# Patient Record
Sex: Male | Born: 1960
Health system: Southern US, Community
[De-identification: ages and names within clinical notes are randomized; demographics above are authoritative.]

## PROBLEM LIST (undated history)

## (undated) DIAGNOSIS — K851 Biliary acute pancreatitis without necrosis or infection: Secondary | ICD-10-CM

## (undated) DIAGNOSIS — F329 Major depressive disorder, single episode, unspecified: Secondary | ICD-10-CM

## (undated) DIAGNOSIS — R351 Nocturia: Secondary | ICD-10-CM

## (undated) DIAGNOSIS — F32A Depression, unspecified: Secondary | ICD-10-CM

## (undated) DIAGNOSIS — M1990 Unspecified osteoarthritis, unspecified site: Secondary | ICD-10-CM

## (undated) DIAGNOSIS — S42409A Unspecified fracture of lower end of unspecified humerus, initial encounter for closed fracture: Secondary | ICD-10-CM

## (undated) DIAGNOSIS — R600 Localized edema: Secondary | ICD-10-CM

## (undated) DIAGNOSIS — I10 Essential (primary) hypertension: Secondary | ICD-10-CM

## (undated) DIAGNOSIS — R030 Elevated blood-pressure reading, without diagnosis of hypertension: Secondary | ICD-10-CM

## (undated) DIAGNOSIS — G4733 Obstructive sleep apnea (adult) (pediatric): Secondary | ICD-10-CM

## (undated) DIAGNOSIS — K219 Gastro-esophageal reflux disease without esophagitis: Secondary | ICD-10-CM

## (undated) DIAGNOSIS — J302 Other seasonal allergic rhinitis: Secondary | ICD-10-CM

## (undated) HISTORY — DX: Unspecified fracture of lower end of unspecified humerus, initial encounter for closed fracture: S42.409A

## (undated) HISTORY — DX: Major depressive disorder, single episode, unspecified: F32.9

## (undated) HISTORY — DX: Depression, unspecified: F32.A

## (undated) HISTORY — DX: Gastro-esophageal reflux disease without esophagitis: K21.9

## (undated) HISTORY — DX: Elevated blood-pressure reading, without diagnosis of hypertension: R03.0

## (undated) HISTORY — DX: Biliary acute pancreatitis without necrosis or infection: K85.10

## (undated) HISTORY — DX: Obstructive sleep apnea (adult) (pediatric): G47.33

## (undated) HISTORY — PX: REFRACTIVE SURGERY: SHX103

## (undated) HISTORY — DX: Other seasonal allergic rhinitis: J30.2

---

## 2005-05-06 ENCOUNTER — Ambulatory Visit: Payer: Self-pay | Admitting: Family Medicine

## 2005-05-08 ENCOUNTER — Ambulatory Visit: Payer: Self-pay | Admitting: Family Medicine

## 2006-01-19 ENCOUNTER — Ambulatory Visit: Payer: Self-pay | Admitting: Family Medicine

## 2006-06-02 ENCOUNTER — Ambulatory Visit: Payer: Self-pay | Admitting: Family Medicine

## 2006-07-24 ENCOUNTER — Ambulatory Visit: Payer: Self-pay | Admitting: Family Medicine

## 2006-08-05 ENCOUNTER — Ambulatory Visit: Payer: Self-pay | Admitting: Family Medicine

## 2008-06-19 ENCOUNTER — Ambulatory Visit: Payer: Self-pay | Admitting: Family Medicine

## 2008-06-19 DIAGNOSIS — S61209A Unspecified open wound of unspecified finger without damage to nail, initial encounter: Secondary | ICD-10-CM | POA: Insufficient documentation

## 2008-06-19 DIAGNOSIS — M79609 Pain in unspecified limb: Secondary | ICD-10-CM | POA: Insufficient documentation

## 2008-07-04 ENCOUNTER — Ambulatory Visit: Payer: Self-pay | Admitting: Family Medicine

## 2011-01-23 ENCOUNTER — Emergency Department: Payer: Self-pay | Admitting: Emergency Medicine

## 2011-12-02 ENCOUNTER — Encounter: Payer: Self-pay | Admitting: Internal Medicine

## 2011-12-02 ENCOUNTER — Encounter: Payer: Self-pay | Admitting: Family Medicine

## 2011-12-02 ENCOUNTER — Ambulatory Visit (INDEPENDENT_AMBULATORY_CARE_PROVIDER_SITE_OTHER): Payer: 59 | Admitting: Family Medicine

## 2011-12-02 VITALS — BP 134/84 | HR 65 | Temp 98.3°F | Ht 75.0 in | Wt 282.0 lb

## 2011-12-02 DIAGNOSIS — K219 Gastro-esophageal reflux disease without esophagitis: Secondary | ICD-10-CM

## 2011-12-02 DIAGNOSIS — Z0000 Encounter for general adult medical examination without abnormal findings: Secondary | ICD-10-CM

## 2011-12-02 DIAGNOSIS — Z8042 Family history of malignant neoplasm of prostate: Secondary | ICD-10-CM

## 2011-12-02 DIAGNOSIS — Z8249 Family history of ischemic heart disease and other diseases of the circulatory system: Secondary | ICD-10-CM

## 2011-12-02 DIAGNOSIS — Z1211 Encounter for screening for malignant neoplasm of colon: Secondary | ICD-10-CM

## 2011-12-02 DIAGNOSIS — L299 Pruritus, unspecified: Secondary | ICD-10-CM

## 2011-12-02 LAB — LIPID PANEL
Cholesterol: 189 mg/dL (ref 0–200)
HDL: 30 mg/dL — ABNORMAL LOW (ref >39.00)
LDL Cholesterol: 140 mg/dL — ABNORMAL HIGH (ref 0–99)
Triglycerides: 94 mg/dL (ref 0.0–149.0)
VLDL: 18.8 mg/dL (ref 0.0–40.0)

## 2011-12-02 LAB — COMPREHENSIVE METABOLIC PANEL
AST: 20 U/L (ref 0–37)
Alkaline Phosphatase: 81 U/L (ref 39–117)
BUN: 18 mg/dL (ref 6–23)
Calcium: 8.7 mg/dL (ref 8.4–10.5)
Creatinine, Ser: 1.1 mg/dL (ref 0.4–1.5)
Total Bilirubin: 0.6 mg/dL (ref 0.3–1.2)

## 2011-12-02 MED ORDER — LORATADINE 10 MG PO TABS: 10.0000 mg | ORAL_TABLET | Freq: Every day | ORAL | Status: AC

## 2011-12-02 MED ORDER — TRIAMCINOLONE ACETONIDE 0.1 % EX CREA: TOPICAL_CREAM | Freq: Two times a day (BID) | CUTANEOUS | Status: AC | PRN

## 2011-12-02 NOTE — Patient Instructions (Signed)
Go to the lab on the way out.  See Shirlee Limerick about your referral before you leave today. I would get a flu shot each fall.   We'll contact you with your lab report. Try to exercise more.  Call back with concerns.  Use claritin/loratadine 10mg  at night as needed for itching. If not improved, use triamcinolone as needed.

## 2011-12-02 NOTE — Progress Notes (Signed)
CPE- See plan.  Routine anticipatory guidance given to patient.  See health maintenance. Td prev done Flu shot encouraged.   Exercise encouraged Labs pending.  See notes on resulted labs.  PSA pending, FH prostate cancer but no dysuria or symptoms.  D/w patient [id number] for colon cancer screening, including IFOB vs. colonoscopy.  Risks and benefits of both were discussed and patient voiced understanding.  Pt elects for: colonoscopy  Dry and itchy skin on legs B in the winter.  No help with OTC meds.  Happens every winter last few years.  Not in summer.   PMH and SH reviewed  Meds, vitals, and allergies reviewed.   ROS: See HPI.  Otherwise negative.    GEN: nad, alert and oriented HEENT: mucous membranes moist NECK: supple w/o LA CV: rrr. PULM: ctab, no inc wob ABD: soft, +bs, soft ventral wall hernia noted.  EXT: no edema SKIN: no acute rash but mild excoriation on lower legs B Prostate gland firm and smooth, no enlargement, nodularity, tenderness, mass, asymmetry or induration.

## 2011-12-03 ENCOUNTER — Encounter: Payer: Self-pay | Admitting: Family Medicine

## 2011-12-03 DIAGNOSIS — Z0000 Encounter for general adult medical examination without abnormal findings: Secondary | ICD-10-CM | POA: Insufficient documentation

## 2011-12-03 DIAGNOSIS — K219 Gastro-esophageal reflux disease without esophagitis: Secondary | ICD-10-CM | POA: Insufficient documentation

## 2011-12-03 DIAGNOSIS — L299 Pruritus, unspecified: Secondary | ICD-10-CM | POA: Insufficient documentation

## 2011-12-03 NOTE — Assessment & Plan Note (Signed)
Controlled with H2 blockade.  D/w pt about weight loss.

## 2011-12-03 NOTE — Assessment & Plan Note (Signed)
Routine anticipatory guidance given to patient.  See health maintenance. Td prev done Flu shot encouraged.   Exercise encouraged Labs pending.  See notes on resulted labs.  PSA pending, FH prostate cancer but no dysuria or symptoms.  D/w patient [id number] for colon cancer screening, including IFOB vs. colonoscopy.  Risks and benefits of both were discussed and patient voiced understanding.  Pt elects for: colonoscopy

## 2011-12-03 NOTE — Assessment & Plan Note (Signed)
See notes on PSA.  DRE wnl.

## 2011-12-03 NOTE — Assessment & Plan Note (Signed)
Use claritin and then TAC if not improved.  He agrees. Likely related to dry skin in winter.  No help with OTC topicals.

## 2012-01-07 HISTORY — PX: COLONOSCOPY: SHX174

## 2012-01-13 ENCOUNTER — Ambulatory Visit (AMBULATORY_SURGERY_CENTER): Payer: 59

## 2012-01-13 ENCOUNTER — Encounter: Payer: Self-pay | Admitting: Family Medicine

## 2012-01-13 ENCOUNTER — Ambulatory Visit (INDEPENDENT_AMBULATORY_CARE_PROVIDER_SITE_OTHER): Payer: 59 | Admitting: Family Medicine

## 2012-01-13 VITALS — BP 132/88 | HR 68 | Temp 97.0°F | Wt 279.5 lb

## 2012-01-13 VITALS — Ht 75.0 in | Wt 276.0 lb

## 2012-01-13 DIAGNOSIS — Z1211 Encounter for screening for malignant neoplasm of colon: Secondary | ICD-10-CM

## 2012-01-13 DIAGNOSIS — L299 Pruritus, unspecified: Secondary | ICD-10-CM

## 2012-01-13 MED ORDER — PEG-KCL-NACL-NASULF-NA ASC-C 100 G PO SOLR: 1.0000 | Freq: Once | ORAL | Status: AC

## 2012-01-13 MED ORDER — PERMETHRIN 5 % EX CREA: TOPICAL_CREAM | Freq: Once | CUTANEOUS | Status: AC

## 2012-01-13 NOTE — Progress Notes (Signed)
  Subjective:    [name], male    DOB: 1961/03/30, 51 y.o.   MRN: [id number]  HPI CC: itchy skin  Itching all over "like crazy", diffusely for last several days, wakes him up at night, last 2 nights especially bad.  Thinks may have been exposed to scabies.  [place] (so scaling on forearms) and bit by something in mid back between shoulder blades which was very tender and itchy, but that is resolving on its own.  Lotion, benadryl spray hasn't helped.  No skin rash.  Itching mainly at night time.  Feels skin crawling.  Denies fevers/chills, abd pain, nausea/vomiting, oral lesions, joint pains, myalgias.  No new medicines, foods, soaps, shampoos, lotions, detergents.  Prior eval: dry and itchy skin on legs B in the winter. No help with OTC meds. Happens every winter last few years. Not in summer.   Thought dry skin from winter, rec loratadine and TAC.  Legs better - thinks claritin working.  Has been taking claritin regularly until yesterday when ran out of med.  No family member with itching.  Wife sleeps in separate room 2/2 pt snoring.  Review of Systems Per HPI    Objective:   Physical Exam  Nursing note and vitals reviewed. Constitutional: He appears well-developed and well-nourished. No distress.  HENT:  Mouth/Throat: Oropharynx is clear and moist. No oropharyngeal exudate.  Cardiovascular: Normal rate, regular rhythm, normal heart sounds and intact distal pulses.   Pulmonary/Chest: Effort normal and breath sounds normal. No respiratory distress. He has no wheezes. He has no rales.  Abdominal: Soft.       No HSM  Musculoskeletal: He exhibits no edema.  Skin: Skin is warm and dry. No rash noted.       Midline upper back with scab, surrounding erythema, pruritic. No rash noted throughout.       Assessment & Plan:

## 2012-01-13 NOTE — Assessment & Plan Note (Addendum)
Diffuse. rec restart claritin. Treat for scabies given possible exposure with permethrin. If not better, update [place]. No systemic sxs today.

## 2012-01-13 NOTE — Patient Instructions (Addendum)
We will treat you as scabies although not typical rash.   Use permethrin cream.  Apply at night ,leave on until next morning and then wash off. Wash all bedding.

## 2012-01-14 ENCOUNTER — Encounter: Payer: Self-pay | Admitting: Internal Medicine

## 2012-01-27 ENCOUNTER — Encounter: Payer: Self-pay | Admitting: Internal Medicine

## 2012-01-27 ENCOUNTER — Ambulatory Visit (AMBULATORY_SURGERY_CENTER): Payer: 59 | Admitting: Internal Medicine

## 2012-01-27 VITALS — BP 137/92 | HR 70 | Temp 96.2°F | Resp 18 | Ht 75.0 in | Wt 276.0 lb

## 2012-01-27 DIAGNOSIS — D128 Benign neoplasm of rectum: Secondary | ICD-10-CM

## 2012-01-27 DIAGNOSIS — D129 Benign neoplasm of anus and anal canal: Secondary | ICD-10-CM

## 2012-01-27 DIAGNOSIS — Z1211 Encounter for screening for malignant neoplasm of colon: Secondary | ICD-10-CM

## 2012-01-27 MED ORDER — SODIUM CHLORIDE 0.9 % IV SOLN: 500.0000 mL | INTRAVENOUS | Status: DC

## 2012-01-27 NOTE — Op Note (Signed)
[facility] [address] [place], [place]  [address]  COLONOSCOPY PROCEDURE REPORT  PATIENT:  Phillip Miles, Phillip Miles  MR#:  [id number] BIRTHDATE:  December 22, 1960, 50 yrs. old  GENDER:  male ENDOSCOPIST:  Iva Boop, MD, [facility] REF. BY:  Crawford Givens, M.D. PROCEDURE DATE:  01/27/2012 PROCEDURE:  Colonoscopy with biopsy ASA CLASS:  Class II INDICATIONS:  Routine Risk Screening MEDICATIONS:   These medications were titrated to patient response per physician's verbal order, Fentanyl 50 mcg IV, Versed 7 mg IV  DESCRIPTION OF PROCEDURE:   After the risks benefits and alternatives of the procedure were thoroughly explained, informed consent was obtained.  Digital rectal exam was performed and revealed no abnormalities and normal prostate.   The LB CF-[id number] endoscope was introduced through the anus and advanced to the cecum, which was identified by both the appendix and ileocecal valve, without limitations.  The quality of the prep was excellent, using MoviPrep.  The instrument was then slowly withdrawn as the colon was fully examined. <<PROCEDUREIMAGES>>  FINDINGS:  A diminutive polyp was found in the rectum. It was 2-3 mm in size.  This was otherwise a normal examination of the colon. Includes right colon retroflexion.   Retroflexed views in the rectum revealed no abnormalities.    The time to cecum = 1:33 minutes. The scope was then withdrawn in 12:53 minutes from the cecum and the procedure completed. COMPLICATIONS:  None ENDOSCOPIC IMPRESSION: 1) 2-3 mm diminutive polyp in the rectum - removed 2) Otherwise normal examination - excellent prep  REPEAT EXAM:  In for Colonoscopy, pending biopsy results.  Iva Boop, MD, [facility]  CC:  Crawford Givens, MD and The Patient  n. eSIGNED:   Iva Boop at 01/27/2012 08:48 AM  Laurena Bering, Peyton Najjar, [id number]

## 2012-01-27 NOTE — Patient Instructions (Signed)
YOU HAD AN ENDOSCOPIC PROCEDURE TODAY AT THE [facility]: Refer to the procedure report that was given to you for any specific questions about what was found during the examination.  If the procedure report does not answer your questions, please call your gastroenterologist to clarify.  If you requested that your care partner not be given the details of your procedure findings, then the procedure report has been included in a sealed envelope for you to review at your convenience later.  YOU SHOULD EXPECT: Some feelings of bloating in the abdomen. Passage of more gas than usual.  Walking can help get rid of the air that was put into your GI tract during the procedure and reduce the bloating. If you had a lower endoscopy (such as a colonoscopy or flexible sigmoidoscopy) you may notice spotting of blood in your stool or on the toilet paper. If you underwent a bowel prep for your procedure, then you may not have a normal bowel movement for a few days.  DIET: Your first meal following the procedure should be a light meal and then it is ok to progress to your normal diet.  A half-sandwich or bowl of soup is an example of a good first meal.  Heavy or fried foods are harder to digest and may make you feel nauseous or bloated.  Likewise meals heavy in dairy and vegetables can cause extra gas to form and this can also increase the bloating.  Drink plenty of fluids but you should avoid alcoholic beverages for 24 hours.  ACTIVITY: Your care partner should take you home directly after the procedure.  You should plan to take it easy, moving slowly for the rest of the day.  You can resume normal activity the day after the procedure however you should NOT DRIVE or use heavy machinery for 24 hours (because of the sedation medicines used during the test).    SYMPTOMS TO REPORT IMMEDIATELY: A gastroenterologist can be reached at any hour.  During normal business hours, 8:30 AM to 5:00 PM Monday through Friday,  call [phone].  After hours and on weekends, please call the GI answering service at [phone] who will take a message and have the physician on call contact you.   Following lower endoscopy (colonoscopy or flexible sigmoidoscopy):  Excessive amounts of blood in the stool  Significant tenderness or worsening of abdominal pains  Swelling of the abdomen that is new, acute  Fever of 100F or higher  Following upper endoscopy (EGD)  Vomiting of blood or coffee ground material  New chest pain or pain under the shoulder blades  Painful or persistently difficult swallowing  New shortness of breath  Fever of 100F or higher  Black, tarry-looking stools  FOLLOW UP: If any biopsies were taken you will be contacted by phone or by letter within the next 1-3 weeks.  Call your gastroenterologist if you have not heard about the biopsies in 3 weeks.  Our staff will call the home number listed on your records the next business day following your procedure to check on you and address any questions or concerns that you may have at that time regarding the information given to you following your procedure. This is a courtesy call and so if there is no answer at the home number and we have not heard from you through the emergency physician on call, we will assume that you have returned to your regular daily activities without incident.  SIGNATURES/CONFIDENTIALITY: You and/or your care   partner have signed paperwork which will be entered into your electronic medical record.  These signatures attest to the fact that that the information above on your After Visit Summary has been reviewed and is understood.  Full responsibility of the confidentiality of this discharge information lies with you and/or your care-partner.  

## 2012-01-27 NOTE — Progress Notes (Signed)
Patient did not experience any of the following events: a burn prior to discharge; a fall within the facility; wrong site/side/patient/procedure/implant event; or a hospital transfer or hospital admission upon discharge from the facility. (G8907) Patient did not have preoperative order for IV antibiotic SSI prophylaxis. (G8918)  

## 2012-01-28 ENCOUNTER — Other Ambulatory Visit: Payer: Self-pay | Admitting: *Deleted

## 2012-01-28 NOTE — Telephone Encounter (Signed)
  Follow up Call-  Call back number 01/27/2012  Post procedure Call Back phone  # [phone]  Permission to leave phone message Yes     Patient questions:  Do you have a fever, pain , or abdominal swelling? no Pain Score  0 *  Have you tolerated food without any problems? yes  Have you been able to return to your normal activities? yes  Do you have any questions about your discharge instructions: Diet   no Medications  no Follow up visit  no  Do you have questions or concerns about your Care? no  Actions: * If pain score is 4 or above: No action needed, pain <4.   

## 2012-01-28 NOTE — Telephone Encounter (Signed)
  Follow up Call-  Call back number 01/27/2012  Post procedure Call Back phone  # [phone]  Permission to leave phone message Yes     Patient questions:  Do you have a fever, pain , or abdominal swelling? no Pain Score  0 *  Have you tolerated food without any problems? yes  Have you been able to return to your normal activities? yes  Do you have any questions about your discharge instructions: Diet   no Medications  no Follow up visit  no  Do you have questions or concerns about your Care? no  Actions: * If pain score is 4 or above: No action needed, pain <4.

## 2012-02-04 ENCOUNTER — Encounter: Payer: Self-pay | Admitting: Internal Medicine

## 2012-02-04 NOTE — Progress Notes (Signed)
Quick Note:  Hyperplastic polyp Repeat colonoscopy about 10 years 01/2022 ______

## 2012-09-08 DIAGNOSIS — K851 Biliary acute pancreatitis without necrosis or infection: Secondary | ICD-10-CM

## 2012-09-08 HISTORY — DX: Biliary acute pancreatitis without necrosis or infection: K85.10

## 2013-03-10 ENCOUNTER — Emergency Department: Payer: Self-pay | Admitting: Emergency Medicine

## 2013-03-10 LAB — COMPREHENSIVE METABOLIC PANEL
BUN: 15 mg/dL (ref 7–18)
Bilirubin,Total: 3.1 mg/dL — ABNORMAL HIGH (ref 0.2–1.0)
Calcium, Total: 8.7 mg/dL (ref 8.5–10.1)
Creatinine: 1.33 mg/dL — ABNORMAL HIGH (ref 0.60–1.30)
EGFR (African American): >60
EGFR (Non-African Amer.): >60
Glucose: 134 mg/dL — ABNORMAL HIGH (ref 65–99)
Osmolality: 284 (ref 275–301)
SGOT(AST): 216 U/L — ABNORMAL HIGH (ref 15–37)
SGPT (ALT): 358 U/L — ABNORMAL HIGH (ref 12–78)
Sodium: 141 mmol/L (ref 136–145)
Total Protein: 7.6 g/dL (ref 6.4–8.2)

## 2013-03-10 LAB — CBC
HCT: 43.9 % (ref 40.0–52.0)
HGB: 15 g/dL (ref 13.0–18.0)
MCH: 29.8 pg (ref 26.0–34.0)
MCHC: 34.3 g/dL (ref 32.0–36.0)
Platelet: 212 10*3/uL (ref 150–440)
RBC: 5.05 10*6/uL (ref 4.40–5.90)
RDW: 14.4 % (ref 11.5–14.5)
WBC: 9.7 10*3/uL (ref 3.8–10.6)

## 2013-03-10 LAB — TROPONIN I: Troponin-I: <0.02 ng/mL

## 2013-05-02 ENCOUNTER — Ambulatory Visit (INDEPENDENT_AMBULATORY_CARE_PROVIDER_SITE_OTHER): Payer: 59 | Admitting: Physician Assistant

## 2013-05-02 ENCOUNTER — Encounter: Payer: Self-pay | Admitting: Physician Assistant

## 2013-05-02 ENCOUNTER — Telehealth: Payer: Self-pay | Admitting: Internal Medicine

## 2013-05-02 ENCOUNTER — Other Ambulatory Visit (INDEPENDENT_AMBULATORY_CARE_PROVIDER_SITE_OTHER): Payer: 59

## 2013-05-02 VITALS — BP 112/90 | HR 76 | Temp 98.6°F | Ht 72.5 in | Wt 281.0 lb

## 2013-05-02 DIAGNOSIS — R1013 Epigastric pain: Secondary | ICD-10-CM

## 2013-05-02 DIAGNOSIS — R112 Nausea with vomiting, unspecified: Secondary | ICD-10-CM

## 2013-05-02 DIAGNOSIS — K859 Acute pancreatitis without necrosis or infection, unspecified: Secondary | ICD-10-CM

## 2013-05-02 DIAGNOSIS — Z860100 Personal history of colon polyps, unspecified: Secondary | ICD-10-CM

## 2013-05-02 LAB — CBC WITH DIFFERENTIAL/PLATELET
Basophils Absolute: 0 10*3/uL (ref 0.0–0.1)
HCT: 44.8 % (ref 39.0–52.0)
Lymphocytes Relative: 29 % (ref 12.0–46.0)
Lymphs Abs: 1.9 10*3/uL (ref 0.7–4.0)
Monocytes Relative: 11.7 % (ref 3.0–12.0)
Neutrophils Relative %: 55.3 % (ref 43.0–77.0)
Platelets: 171 10*3/uL (ref 150.0–400.0)
RDW: 13.6 % (ref 11.5–14.6)

## 2013-05-02 LAB — COMPREHENSIVE METABOLIC PANEL
ALT: 28 U/L (ref 0–53)
CO2: 29 mEq/L (ref 19–32)
GFR: 58.03 mL/min — ABNORMAL LOW (ref >60.00)
Potassium: 3.6 mEq/L (ref 3.5–5.1)
Sodium: 134 mEq/L — ABNORMAL LOW (ref 135–145)
Total Bilirubin: 0.8 mg/dL (ref 0.3–1.2)
Total Protein: 7.5 g/dL (ref 6.0–8.3)

## 2013-05-02 NOTE — Patient Instructions (Addendum)
Please go to the basement level to have your labs drawn.  We scheduled an Upper abdominal Ultrasound. Tues 05-03-2013. Arrive to [facility], 1st floor at 7:45 am.  Go first to patient registration,just inside front door ( by water fountain) .  Have nothing by mouth after midnight.   Try to stay on a low fat diet and full liquid diet until we get back to you with futher instructions.   Use the zofran for nausea and the Hydrocodone for pain.

## 2013-05-02 NOTE — Progress Notes (Signed)
Subjective:    [name], male    DOB: 06/17/1961, 52 y.o.   MRN: [id number]  HPI Phillip Miles is a pleasant generally healthy 52 year old white male known to Dr. Leone Payor from prior screening colonoscopy done in May of 2013. He was found to have 1 small hyperplastic polyp in the rectum and otherwise negative exam. He comes in today as an urgent work in with acute abdominal pain which started on Saturday night at about 1 AM 04/30/2013. He says pain was located in his upper abdomen with some radiation to his back. This was associated with multiple episodes of nausea and vomiting. He says he also had some diarrhea with ease. He says he would feel better for about an hour after he would vomit the pain would last an and then he would have another episode. He said he had a fever yesterday and chills at home and spent most of the day in bed. He says his temp highest was 102. He says he is not having severe pain, he is just uncomfortable. He has been on clear liquids and is tolerating clear liquids without difficulty, but feels weak.  Patient had had an ER visit with a similar episode of pain in early July of 2014. He was seen at the [facility] ER. We have obtained copies of those records and it appears that he did have an episode of pancreatitis. His total bilirubin at that time was 3.1 alkaline phosphatase 173 AST 216 and ALT of 358 WBC 9.7 hemoglobin 15 and lipase was greater than 10,000. He had CT scan of the abdomen and pelvis done with IV but no oral contrast  03/10/2013, and this was read as no acute abnormality He was not admitted, was discharged home with an antiemetic and pain medication and asked to followup with a  gastroenterologist. He says he felt better within the next 24 hours and gradually improved and did not seek medical care at that time. He states over the past several weeks he has had twinges of upper [place] pain but no severe episodes until this past weekend.  He does not drink  alcohol on a regular basis. He does have a mother and sister both of whom have had gallbladder problems.    Review of Systems  Constitutional: Positive for fever, chills and appetite change.  HENT: Negative.   Eyes: Negative.   Respiratory: Negative.   Gastrointestinal: Positive for nausea, vomiting, abdominal pain and diarrhea.  Endocrine: Negative.   Genitourinary: Negative.   Musculoskeletal: Negative.   Skin: Negative.   Allergic/Immunologic: Negative.   Neurological: Negative.   Hematological: Negative.   Psychiatric/Behavioral: Negative.    Outpatient Prescriptions Prior to Visit  Medication Sig Dispense Refill  . ranitidine (ZANTAC) 75 MG tablet Take 75 mg by mouth daily.      . permethrin (ELIMITE) 5 % cream        No facility-administered medications prior to visit.      No Known Allergies Patient Active Problem List   Diagnosis Date Noted  . Personal history of colonic polyps 05/02/2013  . GERD (gastroesophageal reflux disease) 12/03/2011  . Routine general medical examination at a health care facility 12/03/2011  . Itchy skin 12/03/2011  . FH: prostate cancer 12/03/2011   History  Substance Use Topics  . Smoking status: Former Smoker -- 2.00 packs/day for 23 years    Types: Cigarettes    Quit date: 09/08/1996  . Smokeless tobacco: Never Used  . Alcohol Use: Yes  Comment: rare   family history includes Alcohol abuse in his father; Diabetes in his mother; Heart disease in his father; Prostate cancer in his father.  Objective:   Physical Exam  well-developed white male in no acute distress, uncomfortable appearing blood pressure 112/90 pulse 76 temperature 98.6 height 6 foot weight 281. HEENT; nontraumatic normocephalic EOMI PERRLA sclera anicteric, Supple, Cardiovascular; regular rate and rhythm with S1-S2 no murmur or gallop, Pulmonary ;clear bilaterally, Abdomen; soft bowel sounds are present but hypoactive he is tender in the epigastrium there is no  guarding or rebound no palpable mass or hepatosplenomegaly, Rectal; exam not done, Extremities; no clubbing cyanosis or edema skin warm and dry, Psych; mood and affect normal and appropriate        Assessment & Plan:  #[age] year old male with an episode of acute pancreatitis 03/10/2013 associated with elevated LFTs and CT scan at that time read as negative. Episode suspicious for  Biliary pancreatitis. Patient now with recurrent similar episode onset approximately 48 hours ago. Suspect recurrent pancreatitis, rule out choledocholithiasis. Patient is stable and nontoxic appearing today, he did have a fever yesterday. I do not think he requires hospitalization at this time .  Plan; check CBC with differential, CMET and lipase today Schedule for upper abdominal ultrasound within the next 24 hours Low fat full liquid diet and push fluids Pt  has a prescription for Zofran and hydrocodone from his previous ER visit and will use these as needed for pain and nausea. He is instructed to seek ER care if he has worsening of abdominal pain temp of over 101 or recurrent intractable vomiting. We discussed possibility of need for procedures and other imaging depending on results of labs and ultrasound and he expressed understanding

## 2013-05-02 NOTE — Telephone Encounter (Signed)
Patient was seen last month at [facility] ER for pancreatitis.  Pain and fever returned this weekend.  He will come in and see Mike Gip PA today at 1:30

## 2013-05-03 ENCOUNTER — Telehealth: Payer: Self-pay | Admitting: *Deleted

## 2013-05-03 ENCOUNTER — Ambulatory Visit (HOSPITAL_COMMUNITY)
Admission: RE | Admit: 2013-05-03 | Discharge: 2013-05-03 | Disposition: A | Discharge status: Home / Self Care | Payer: 59 | Source: Ambulatory Visit | Attending: Physician Assistant | Admitting: Physician Assistant

## 2013-05-03 DIAGNOSIS — R112 Nausea with vomiting, unspecified: Secondary | ICD-10-CM

## 2013-05-03 DIAGNOSIS — K838 Other specified diseases of biliary tract: Secondary | ICD-10-CM | POA: Insufficient documentation

## 2013-05-03 DIAGNOSIS — K8020 Calculus of gallbladder without cholecystitis without obstruction: Secondary | ICD-10-CM

## 2013-05-03 DIAGNOSIS — K7689 Other specified diseases of liver: Secondary | ICD-10-CM | POA: Insufficient documentation

## 2013-05-03 DIAGNOSIS — R1013 Epigastric pain: Secondary | ICD-10-CM

## 2013-05-03 DIAGNOSIS — K859 Acute pancreatitis without necrosis or infection, unspecified: Secondary | ICD-10-CM

## 2013-05-03 NOTE — Progress Notes (Signed)
[place] has returned w/ cholelithiasis and borderline dilated CBD though it was not seen well. Lipase and LFT's normal. I recommended an MRCP next.  Iva Boop, MD, [facility]

## 2013-05-03 NOTE — Progress Notes (Signed)
Please call pt, see how he is feeling today - let him know his labs all look good- no pancreatitis this time, and liver testes are normal. The [place] does show gallstones and  I think he is having gallbladder attacks. Also duct not normal , but not well seen on the [place] . Would like him to get scheduled asap for MRCP to r/o CBD stone, and also get appt this week to see surgery. If he is feeling better he can advance his diet  To soft, low fat.  Thanks

## 2013-05-03 NOTE — Telephone Encounter (Signed)
Message copied by Daphine Deutscher on Tue May 03, 2013  1:51 PM ------      Message from: Sammuel Cooper      Created: Tue May 03, 2013  1:48 PM                   ----- Message -----         From: Iva Boop, MD         Sent: 05/03/2013  12:01 PM           To: Sammuel Cooper, PA-C                        ----- Message -----         From: Sammuel Cooper, PA-C         Sent: 05/02/2013   3:18 PM           To: Iva Boop, MD             ------

## 2013-05-03 NOTE — Telephone Encounter (Signed)
Sammuel Cooper, PA-C at 05/03/2013 1:48 PM   Status: Signed            Please call pt, see how he is feeling today - let him know his labs all look good- no pancreatitis this time, and liver testes are normal. The [place] does show gallstones and I think he is having gallbladder attacks. Also duct not normal , but not well seen on the [place] . Would like him to get scheduled asap for MRCP to r/o CBD stone, and also get appt this week to see surgery. If he is feeling better he can advance his diet To soft, low fat. Thanks  Patient given results and recommendations. Scheduled MRCP on 05/11/13 at 8:00 AM. NPO after midnight. Elease Hashimoto). Scheduled at [facility] on 05/04/13 at 10:00/10:30 AM with Dr. Andrey Campanile. Patient aware of appointments and instructions. He is feeling better and will advance diet to soft, low fat.

## 2013-05-04 ENCOUNTER — Encounter (INDEPENDENT_AMBULATORY_CARE_PROVIDER_SITE_OTHER): Payer: Self-pay | Admitting: General Surgery

## 2013-05-04 ENCOUNTER — Ambulatory Visit (INDEPENDENT_AMBULATORY_CARE_PROVIDER_SITE_OTHER): Payer: 59 | Admitting: General Surgery

## 2013-05-04 VITALS — BP 130/82 | HR 76 | Resp 14 | Ht 72.0 in | Wt 278.4 lb

## 2013-05-04 DIAGNOSIS — K8020 Calculus of gallbladder without cholecystitis without obstruction: Secondary | ICD-10-CM | POA: Insufficient documentation

## 2013-05-04 DIAGNOSIS — K859 Acute pancreatitis without necrosis or infection, unspecified: Secondary | ICD-10-CM

## 2013-05-04 DIAGNOSIS — K851 Biliary acute pancreatitis without necrosis or infection: Secondary | ICD-10-CM | POA: Insufficient documentation

## 2013-05-04 NOTE — Patient Instructions (Signed)

## 2013-05-05 ENCOUNTER — Encounter (HOSPITAL_COMMUNITY): Payer: Self-pay | Admitting: Pharmacy Technician

## 2013-05-05 NOTE — Progress Notes (Signed)
[name], male   DOB: 02/22/1961, 51 y.o.   MRN: [id number]  Chief Complaint  Patient presents with  . New Evaluation    eval GB    HPI Phillip Miles is a 51 y.o. male.   HPI 51 yo WM referred by Amy Esterwood, PA, and Dr Gessner for evaluation of abdominal pain and probable biliary pancreatitis. The patient states he's had several episodes of epigastric pain radiating to his back. He describes the pain as sharp. He states that this lasted hours at a time. He states that one episode happened after eating spaghetti. Most episodes are associated with nausea and vomiting. He had one episode that was quite severe for which prompted him to go to the emergency room where he was evaluated. He was diagnosed with pancreatitis. [facility] referring providers notes he had a bilirubin of 3.1, alkaline phosphatase 173, AST 216 an ALT of 358. CBC was normal. Lipase was greater than 10,000. A CT scan was within normal limits and he was sent home on medical treatment. He states he had another flare this past Sunday with multiple episodes of vomiting and diarrhea along with fever and chills. He states that he just felt crummy. He didn't have a lot of abdominal pain per se. He had an abdominal ultrasound which revealed gallstones. It is felt that his pancreatitis is due to cholelithiasis. He denies any alcohol. He denies any new medications. Past Medical History  Diagnosis Date  . Depression     h/o, job related, resolved as of 2013  . GERD (gastroesophageal reflux disease)   . Elbow fracture     left  . Colon polyps   . Pancreatitis     History reviewed. No pertinent past surgical history.  Family History  Problem Relation Age of Onset  . Diabetes Mother   . Alcohol abuse Father   . Prostate cancer Father   . Heart disease Father   . Cancer Father     prostate    Social History History  Substance Use Topics  . Smoking status: Former Smoker -- 2.00 packs/day for 23 years    Types:  Cigarettes    Quit date: 09/08/1996  . Smokeless tobacco: Never Used  . Alcohol Use: Yes     Comment: rare    No Known Allergies  Current Outpatient Prescriptions  Medication Sig Dispense Refill  . acetaminophen (TYLENOL) 325 MG tablet Take 650 mg by mouth every 6 (six) hours as needed for pain.       . ondansetron (ZOFRAN-ODT) 4 MG disintegrating tablet Take 4 mg by mouth 4 (four) times daily as needed for nausea.      . oxyCODONE (OXY IR/ROXICODONE) 5 MG immediate release tablet Take 10 mg by mouth every 4 (four) hours as needed for pain.      . ranitidine (ZANTAC) 150 MG tablet Take 150 mg by mouth daily.       No current facility-administered medications for this visit.    Review of Systems Review of Systems  Constitutional: Positive for fever. Negative for chills, appetite change and unexpected weight change.  HENT: Negative for congestion and trouble swallowing.   Eyes: Negative for visual disturbance.  Respiratory: Negative for chest tightness and shortness of breath.   Cardiovascular: Negative for chest pain and leg swelling.       No PND, no orthopnea, no DOE  Gastrointestinal: Positive for nausea, vomiting, abdominal pain and diarrhea.       See HPI    Genitourinary: Negative for dysuria and hematuria.  Musculoskeletal: Negative.   Skin: Negative for rash.  Neurological: Positive for headaches. Negative for seizures and speech difficulty.  Hematological: Does not bruise/bleed easily.  Psychiatric/Behavioral: Negative for behavioral problems and confusion.    Blood pressure 130/82, pulse 76, resp. rate 14, height 6' (1.829 m), weight 278 lb 6.4 oz (126.281 kg).  Physical Exam Physical Exam  Vitals reviewed. Constitutional: He is oriented to person, place, and time. He appears well-developed and well-nourished. No distress.  HENT:  Head: Normocephalic and atraumatic.  Right Ear: External ear normal.  Left Ear: External ear normal.  Eyes: Conjunctivae are normal.  No scleral icterus.  Neck: Normal range of motion. Neck supple. No tracheal deviation present. No thyromegaly present.  Cardiovascular: Normal rate, normal heart sounds and intact distal pulses.   Pulmonary/Chest: Effort normal and breath sounds normal. No respiratory distress. He has no wheezes.  Abdominal: Soft. He exhibits no distension. There is no tenderness. There is no rebound and no guarding.  Musculoskeletal: Normal range of motion. He exhibits no edema and no tenderness.  Lymphadenopathy:    He has no cervical adenopathy.  Neurological: He is alert and oriented to person, place, and time. He exhibits normal muscle tone.  Skin: Skin is warm and dry. No rash noted. He is not diaphoretic. No erythema. No pallor.  Psychiatric: He has a normal mood and affect. His behavior is normal. Judgment and thought content normal.    Data Reviewed Esterwood's note 8/25 Labs 8/25 - nml cbc, cmet, lipase COMPLETE ABDOMINAL ULTRASOUND 05/02/13 Comparison: None.  Findings:  Gallbladder: Cholelithiasis. Stones which measure up to 1.7 cm.  No wall thickening or pericholecystic fluid. Sonographic Murphy's  sign was not elicited.  Common bile duct: Poorly visualized due to overlying bowel gas.  Upper normal to minimally dilated, 8 mm. Upper normal in this age  group 6 - 7 mm.  Liver: Increased in echogenicity.  IVC: Poorly visualized due to overlying bowel gas.  Pancreas: Poorly visualized due to overlying bowel gas.  Spleen: Normal in size and echogenicity.  Right Kidney: 10.8 cm. No hydronephrosis.  Left Kidney: 11.5 cm. No hydronephrosis.  Abdominal aorta: Nonaneurysmal without ascites. Partially  obscured proximally.  IMPRESSION:  1. Cholelithiasis without acute cholecystitis.  2. Borderline to minimal dilatation of the common duct. This is  partially obscured. Choledocholithiasis or other cause of distal  obstruction cannot be excluded. Consider contrast enhanced CT or  MRCP.  3.  Portions of anatomy obscured by overlying bowel gas.  4. Hepatic steatosis.  Assessment    Biliary pancreatitis Symptomatic Cholelithiasis     Plan    I believe the patient's symptoms are consistent with gallbladder disease.  We discussed gallbladder disease. The patient was given educational material. We discussed non-operative and operative management. We discussed the signs & symptoms of acute cholecystitis  I discussed laparoscopic cholecystectomy with IOC in detail.  The patient was given educational material as well as diagrams detailing the procedure.  We discussed the risks and benefits of a laparoscopic cholecystectomy including, but not limited to bleeding, infection, injury to surrounding structures such as the intestine or liver, bile leak, retained gallstones, need to convert to an open procedure, prolonged diarrhea, blood clots such as  DVT, common bile duct injury, anesthesia risks, and possible need for additional procedures.  We discussed the typical post-operative recovery course. I explained that the likelihood of improvement of their symptoms is good.  The patient is currently scheduled for MRCP   on September 3. He is then scheduled for laparoscopic cholecystectomy for that Friday, September 5. The patient was told that if there were some abnormalities seen on his MRCP That it may change the sequence of events. My suspicion for persistent common bile duct stone is low since his LFTs have returned to normal. However I do think it's important to get the MRCP preoperatively. If there is a common bile duct stone still present he will need an ERCP prior to cholecystectomy and we may have to change our timing of his surgery  Errika Narvaiz M. Mechell Girgis, MD, FACS General, Bariatric, & Minimally Invasive Surgery [facility]          Tasheem Elms M 05/05/2013, 5:24 PM    

## 2013-05-11 ENCOUNTER — Telehealth (INDEPENDENT_AMBULATORY_CARE_PROVIDER_SITE_OTHER): Payer: Self-pay | Admitting: General Surgery

## 2013-05-11 ENCOUNTER — Ambulatory Visit (HOSPITAL_COMMUNITY)
Admission: RE | Admit: 2013-05-11 | Discharge: 2013-05-11 | Disposition: A | Discharge status: Home / Self Care | Payer: 59 | Source: Ambulatory Visit | Attending: Physician Assistant | Admitting: Physician Assistant

## 2013-05-11 ENCOUNTER — Other Ambulatory Visit: Payer: Self-pay | Admitting: Physician Assistant

## 2013-05-11 ENCOUNTER — Encounter (HOSPITAL_COMMUNITY): Payer: Self-pay

## 2013-05-11 ENCOUNTER — Encounter (HOSPITAL_COMMUNITY)
Admission: RE | Admit: 2013-05-11 | Discharge: 2013-05-11 | Disposition: A | Discharge status: Home / Self Care | Payer: 59 | Source: Ambulatory Visit | Attending: General Surgery | Admitting: General Surgery

## 2013-05-11 DIAGNOSIS — K8020 Calculus of gallbladder without cholecystitis without obstruction: Secondary | ICD-10-CM

## 2013-05-11 DIAGNOSIS — Z1389 Encounter for screening for other disorder: Secondary | ICD-10-CM | POA: Insufficient documentation

## 2013-05-11 HISTORY — DX: Unspecified osteoarthritis, unspecified site: M19.90

## 2013-05-11 HISTORY — DX: Nocturia: R35.1

## 2013-05-11 MED ORDER — GADOBENATE DIMEGLUMINE 529 MG/ML IV SOLN
20.0000 mL | Freq: Once | INTRAVENOUS | Status: AC | PRN
  Administered 2013-05-11: 20 mL via INTRAVENOUS

## 2013-05-11 NOTE — Progress Notes (Signed)
05/11/13 1104  OBSTRUCTIVE SLEEP APNEA  Have you ever been diagnosed with sleep apnea through a sleep study? No  Do you snore loudly (loud enough to be heard through closed doors)?  1  Do you often feel tired, fatigued, or sleepy during the daytime? 1  Has anyone observed you stop breathing during your sleep? 0  Do you have, or are you being treated for high blood pressure? 0  BMI more than 35 kg/m2? 1  Age over 52 years old? 1  Neck circumference greater than 40 cm/18 inches? 1  Gender: 1  Obstructive Sleep Apnea Score 6  Score 4 or greater  Results sent to PCP

## 2013-05-11 NOTE — Patient Instructions (Addendum)
Benedicto Capozzi [place]icek  05/11/2013                           YOUR PROCEDURE IS SCHEDULED ON:  05/13/13               PLEASE REPORT TO SHORT STAY CENTER AT : 10:45 am               CALL THIS NUMBER IF ANY PROBLEMS THE DAY OF SURGERY :               [phone]                      REMEMBER:   Do not eat food or drink liquids AFTER MIDNIGHT  May have clear liquids UNTIL 6 HOURS BEFORE SURGERY (7:00 am)  Clear liquids include soda, tea, black coffee, apple or grape juice, broth.  Take these medicines the morning of surgery with A SIP OF WATER:   may take oxycodone if needed   Do not wear jewelry, make-up   Do not wear lotions, powders, or perfumes.   Do not shave legs or underarms 12 hrs. before surgery (men may shave face)  Do not bring valuables to the hospital.  Contacts, dentures or bridgework may not be worn into surgery.  Leave suitcase in the car. After surgery it may be brought to your room.  For patients admitted to the hospital more than one night, checkout time is 11:00                          The day of discharge.   Patients discharged the day of surgery will not be allowed to drive home                             If going home same day of surgery, must have someone stay with you first                           24 hrs at home and arrange for some one to drive you home from hospital.    Special Instructions:   Please read over the following fact sheets that you were given:                        1. [place] PREPARING FOR SURGERY SHEET                                                X_____________________________________________________________________        Failure to follow these instructions may result in cancellation of your surgery

## 2013-05-11 NOTE — Telephone Encounter (Signed)
Call to discuss MRI-MRCP results. There is no evidence of choledocholithiasis. No evidence of pancreatitis. No evidence of lesions. Positive gallstones without any evidence of cholecystitis.  Told the patient we could proceed with surgery on Friday to remove his gallbladder. Patient had no questions

## 2013-05-13 ENCOUNTER — Encounter (HOSPITAL_COMMUNITY)
Admission: RE | Disposition: A | Discharge status: Home / Self Care | Payer: Self-pay | Source: Ambulatory Visit | Attending: General Surgery

## 2013-05-13 ENCOUNTER — Observation Stay (HOSPITAL_COMMUNITY)
Admission: RE | Admit: 2013-05-13 | Discharge: 2013-05-14 | Disposition: A | Discharge status: Home / Self Care | Payer: 59 | Source: Ambulatory Visit | Attending: General Surgery | Admitting: General Surgery

## 2013-05-13 ENCOUNTER — Ambulatory Visit (HOSPITAL_COMMUNITY): Payer: 59 | Admitting: Anesthesiology

## 2013-05-13 ENCOUNTER — Ambulatory Visit (HOSPITAL_COMMUNITY): Payer: 59

## 2013-05-13 ENCOUNTER — Encounter (HOSPITAL_COMMUNITY): Payer: Self-pay | Admitting: Anesthesiology

## 2013-05-13 ENCOUNTER — Encounter (HOSPITAL_COMMUNITY): Payer: Self-pay | Admitting: *Deleted

## 2013-05-13 DIAGNOSIS — K8010 Calculus of gallbladder with chronic cholecystitis without obstruction: Secondary | ICD-10-CM

## 2013-05-13 DIAGNOSIS — K859 Acute pancreatitis without necrosis or infection, unspecified: Secondary | ICD-10-CM | POA: Insufficient documentation

## 2013-05-13 DIAGNOSIS — Z01812 Encounter for preprocedural laboratory examination: Secondary | ICD-10-CM | POA: Insufficient documentation

## 2013-05-13 DIAGNOSIS — K829 Disease of gallbladder, unspecified: Secondary | ICD-10-CM

## 2013-05-13 DIAGNOSIS — Z01810 Encounter for preprocedural cardiovascular examination: Secondary | ICD-10-CM | POA: Insufficient documentation

## 2013-05-13 DIAGNOSIS — K219 Gastro-esophageal reflux disease without esophagitis: Secondary | ICD-10-CM | POA: Insufficient documentation

## 2013-05-13 DIAGNOSIS — K7689 Other specified diseases of liver: Secondary | ICD-10-CM | POA: Insufficient documentation

## 2013-05-13 HISTORY — PX: CHOLECYSTECTOMY: SHX55

## 2013-05-13 SURGERY — LAPAROSCOPIC CHOLECYSTECTOMY WITH INTRAOPERATIVE CHOLANGIOGRAM
Anesthesia: General | Site: Abdomen | Wound class: Contaminated

## 2013-05-13 MED ORDER — ACETAMINOPHEN 325 MG PO TABS: 650.0000 mg | ORAL_TABLET | ORAL | Status: DC | PRN

## 2013-05-13 MED ORDER — ENOXAPARIN SODIUM 40 MG/0.4ML ~~LOC~~ SOLN
40.0000 mg | SUBCUTANEOUS | Status: DC
  Administered 2013-05-14: 40 mg via SUBCUTANEOUS
  Filled 2013-05-13 (×2): qty 0.4

## 2013-05-13 MED ORDER — NEOSTIGMINE METHYLSULFATE 1 MG/ML IJ SOLN
INTRAMUSCULAR | Status: DC | PRN
  Administered 2013-05-13: 4 mg via INTRAVENOUS

## 2013-05-13 MED ORDER — OXYCODONE HCL 5 MG/5ML PO SOLN
5.0000 mg | Freq: Once | ORAL | Status: DC | PRN
  Filled 2013-05-13: qty 5

## 2013-05-13 MED ORDER — SUFENTANIL CITRATE 50 MCG/ML IV SOLN
INTRAVENOUS | Status: DC | PRN
  Administered 2013-05-13 (×4): 10 ug via INTRAVENOUS
  Administered 2013-05-13: 5 ug via INTRAVENOUS

## 2013-05-13 MED ORDER — ONDANSETRON HCL 4 MG/2ML IJ SOLN
INTRAMUSCULAR | Status: DC | PRN
  Administered 2013-05-13: 4 mg via INTRAVENOUS

## 2013-05-13 MED ORDER — SUCCINYLCHOLINE CHLORIDE 20 MG/ML IJ SOLN
INTRAMUSCULAR | Status: DC | PRN
  Administered 2013-05-13: 100 mg via INTRAVENOUS

## 2013-05-13 MED ORDER — MORPHINE SULFATE 2 MG/ML IJ SOLN
1.0000 mg | INTRAMUSCULAR | Status: DC | PRN
  Administered 2013-05-13: 2 mg via INTRAVENOUS
  Filled 2013-05-13: qty 1

## 2013-05-13 MED ORDER — GLYCOPYRROLATE 0.2 MG/ML IJ SOLN
INTRAMUSCULAR | Status: DC | PRN
  Administered 2013-05-13: 0.6 mg via INTRAVENOUS

## 2013-05-13 MED ORDER — LACTATED RINGERS IV SOLN
INTRAVENOUS | Status: DC
  Administered 2013-05-13: 14:00:00 via INTRAVENOUS
  Administered 2013-05-13: 1000 mL via INTRAVENOUS
  Administered 2013-05-13: 15:00:00 via INTRAVENOUS

## 2013-05-13 MED ORDER — 0.9 % SODIUM CHLORIDE (POUR BTL) OPTIME
TOPICAL | Status: DC | PRN
  Administered 2013-05-13: 1000 mL

## 2013-05-13 MED ORDER — KCL IN DEXTROSE-NACL 20-5-0.45 MEQ/L-%-% IV SOLN
INTRAVENOUS | Status: DC
  Administered 2013-05-13 – 2013-05-14 (×2): via INTRAVENOUS
  Filled 2013-05-13 (×3): qty 1000

## 2013-05-13 MED ORDER — BUPIVACAINE-EPINEPHRINE PF 0.25-1:200000 % IJ SOLN
INTRAMUSCULAR | Status: AC
  Filled 2013-05-13: qty 30

## 2013-05-13 MED ORDER — ONDANSETRON HCL 4 MG PO TABS: 4.0000 mg | ORAL_TABLET | Freq: Four times a day (QID) | ORAL | Status: DC | PRN

## 2013-05-13 MED ORDER — HYDROMORPHONE HCL PF 1 MG/ML IJ SOLN
INTRAMUSCULAR | Status: AC
  Filled 2013-05-13: qty 1

## 2013-05-13 MED ORDER — BUPIVACAINE-EPINEPHRINE 0.25% -1:200000 IJ SOLN
INTRAMUSCULAR | Status: DC | PRN
  Administered 2013-05-13: 50 mL

## 2013-05-13 MED ORDER — OXYCODONE HCL 5 MG PO TABS: 5.0000 mg | ORAL_TABLET | Freq: Once | ORAL | Status: DC | PRN

## 2013-05-13 MED ORDER — LABETALOL HCL 5 MG/ML IV SOLN
INTRAVENOUS | Status: DC | PRN
  Administered 2013-05-13: 5 mg via INTRAVENOUS

## 2013-05-13 MED ORDER — PROMETHAZINE HCL 25 MG/ML IJ SOLN: 6.2500 mg | INTRAMUSCULAR | Status: DC | PRN

## 2013-05-13 MED ORDER — PROPOFOL 10 MG/ML IV BOLUS
INTRAVENOUS | Status: DC | PRN
  Administered 2013-05-13: 50 mg via INTRAVENOUS
  Administered 2013-05-13: 250 mg via INTRAVENOUS
  Administered 2013-05-13: 50 mg via INTRAVENOUS
  Administered 2013-05-13: 100 mg via INTRAVENOUS

## 2013-05-13 MED ORDER — HYDROMORPHONE HCL PF 1 MG/ML IJ SOLN
0.2500 mg | INTRAMUSCULAR | Status: DC | PRN
  Administered 2013-05-13 (×3): 0.5 mg via INTRAVENOUS

## 2013-05-13 MED ORDER — ROCURONIUM BROMIDE 100 MG/10ML IV SOLN
INTRAVENOUS | Status: DC | PRN
  Administered 2013-05-13: 50 mg via INTRAVENOUS

## 2013-05-13 MED ORDER — DEXAMETHASONE SODIUM PHOSPHATE 10 MG/ML IJ SOLN
INTRAMUSCULAR | Status: DC | PRN
  Administered 2013-05-13: 10 mg via INTRAVENOUS

## 2013-05-13 MED ORDER — OXYCODONE-ACETAMINOPHEN 5-325 MG PO TABS
1.0000 | ORAL_TABLET | ORAL | Status: DC | PRN
  Administered 2013-05-14: 2 via ORAL
  Filled 2013-05-13: qty 2

## 2013-05-13 MED ORDER — MEPERIDINE HCL 50 MG/ML IJ SOLN: 6.2500 mg | INTRAMUSCULAR | Status: DC | PRN

## 2013-05-13 MED ORDER — PANTOPRAZOLE SODIUM 40 MG IV SOLR
40.0000 mg | INTRAVENOUS | Status: DC
  Administered 2013-05-13: 40 mg via INTRAVENOUS
  Filled 2013-05-13 (×2): qty 40

## 2013-05-13 MED ORDER — MIDAZOLAM HCL 5 MG/5ML IJ SOLN
INTRAMUSCULAR | Status: DC | PRN
  Administered 2013-05-13: 2 mg via INTRAVENOUS

## 2013-05-13 MED ORDER — LIDOCAINE HCL (CARDIAC) 20 MG/ML IV SOLN
INTRAVENOUS | Status: DC | PRN
  Administered 2013-05-13: 100 mg via INTRAVENOUS

## 2013-05-13 MED ORDER — DEXTROSE 5 % IV SOLN
2.0000 g | INTRAVENOUS | Status: AC
  Administered 2013-05-13: 2 g via INTRAVENOUS
  Filled 2013-05-13: qty 2

## 2013-05-13 MED ORDER — LACTATED RINGERS IV SOLN
INTRAVENOUS | Status: DC | PRN
  Administered 2013-05-13: 2000 mL via INTRAVENOUS

## 2013-05-13 MED ORDER — IOHEXOL 300 MG/ML  SOLN
INTRAMUSCULAR | Status: DC | PRN
  Administered 2013-05-13: 50 mL via INTRAVENOUS

## 2013-05-13 MED ORDER — ONDANSETRON HCL 4 MG/2ML IJ SOLN: 4.0000 mg | Freq: Four times a day (QID) | INTRAMUSCULAR | Status: DC | PRN

## 2013-05-13 SURGICAL SUPPLY — 52 items
ADH SKN CLS APL DERMABOND .7 (GAUZE/BANDAGES/DRESSINGS) ×1
APL SKNCLS STERI-STRIP NONHPOA (GAUZE/BANDAGES/DRESSINGS)
APPLIER CLIP 5 13 M/L LIGAMAX5 (MISCELLANEOUS)
APPLIER CLIP ROT 10 11.4 M/L (STAPLE)
APR CLP MED LRG 11.4X10 (STAPLE)
APR CLP MED LRG 5 ANG JAW (MISCELLANEOUS)
BAG SPEC RTRVL LRG 6X4 10 (ENDOMECHANICALS) ×1
BANDAGE ADHESIVE 1X3 (GAUZE/BANDAGES/DRESSINGS) ×3 IMPLANT
BENZOIN TINCTURE PRP APPL 2/3 (GAUZE/BANDAGES/DRESSINGS) ×1 IMPLANT
CANISTER SUCTION 2500CC (MISCELLANEOUS) ×2 IMPLANT
CATH ROBINSON RED A/P 14FR (CATHETERS) ×1 IMPLANT
CHLORAPREP W/TINT 26ML (MISCELLANEOUS) ×2 IMPLANT
CLIP APPLIE 5 13 M/L LIGAMAX5 (MISCELLANEOUS) IMPLANT
CLIP APPLIE ROT 10 11.4 M/L (STAPLE) IMPLANT
CLOTH BEACON ORANGE TIMEOUT ST (SAFETY) ×2 IMPLANT
COVER MAYO STAND STRL (DRAPES) ×1 IMPLANT
COVER SURGICAL LIGHT HANDLE (MISCELLANEOUS) ×1 IMPLANT
DECANTER SPIKE VIAL GLASS SM (MISCELLANEOUS) ×1 IMPLANT
DERMABOND ADVANCED (GAUZE/BANDAGES/DRESSINGS) ×1
DERMABOND ADVANCED .7 DNX12 (GAUZE/BANDAGES/DRESSINGS) IMPLANT
DRAPE C-ARM 42X120 X-RAY (DRAPES) IMPLANT
DRAPE LAPAROSCOPIC ABDOMINAL (DRAPES) ×2 IMPLANT
DRAPE UTILITY XL STRL (DRAPES) ×2 IMPLANT
DRSG TEGADERM 2-3/8X2-3/4 SM (GAUZE/BANDAGES/DRESSINGS) ×2 IMPLANT
ELECT REM PT RETURN 9FT ADLT (ELECTROSURGICAL) ×2
ELECTRODE REM PT RTRN 9FT ADLT (ELECTROSURGICAL) ×1 IMPLANT
GLOVE BIO SURGEON STRL SZ7.5 (GLOVE) ×2 IMPLANT
GLOVE BIOGEL M STRL SZ7.5 (GLOVE) ×1 IMPLANT
GLOVE BIOGEL PI IND STRL 7.0 (GLOVE) ×1 IMPLANT
GLOVE BIOGEL PI INDICATOR 7.0 (GLOVE) ×1
GLOVE INDICATOR 8.0 STRL GRN (GLOVE) ×2 IMPLANT
GOWN STRL NON-REIN LRG LVL3 (GOWN DISPOSABLE) ×1 IMPLANT
GOWN STRL REIN XL XLG (GOWN DISPOSABLE) ×6 IMPLANT
KIT BASIN OR (CUSTOM PROCEDURE TRAY) ×2 IMPLANT
NS IRRIG 1000ML POUR BTL (IV SOLUTION) ×2 IMPLANT
POUCH SPECIMEN RETRIEVAL 10MM (ENDOMECHANICALS) ×2 IMPLANT
SET CHOLANGIOGRAPH MIX (MISCELLANEOUS) ×1 IMPLANT
SET IRRIG TUBING LAPAROSCOPIC (IRRIGATION / IRRIGATOR) ×2 IMPLANT
SOLUTION ANTI FOG 6CC (MISCELLANEOUS) ×2 IMPLANT
STRIP CLOSURE SKIN 1/2X4 (GAUZE/BANDAGES/DRESSINGS) ×1 IMPLANT
SUT MNCRL AB 4-0 PS2 18 (SUTURE) ×2 IMPLANT
SUT VIC AB 2-0 SH 27 (SUTURE) ×2
SUT VIC AB 2-0 SH 27X BRD (SUTURE) IMPLANT
SUT VICRYL 0 UR6 27IN ABS (SUTURE) ×1 IMPLANT
SUT VICRYL 2 0 18  UND BR (SUTURE) ×1
SUT VICRYL 2 0 18 UND BR (SUTURE) IMPLANT
TOWEL OR 17X26 10 PK STRL BLUE (TOWEL DISPOSABLE) ×2 IMPLANT
TRAY LAP CHOLE (CUSTOM PROCEDURE TRAY) ×2 IMPLANT
TROCAR BLADELESS OPT 5 75 (ENDOMECHANICALS) ×6 IMPLANT
TROCAR XCEL BLUNT TIP 100MML (ENDOMECHANICALS) ×2 IMPLANT
TROCAR XCEL NON-BLD 11X100MML (ENDOMECHANICALS) ×1 IMPLANT
TUBING INSUFFLATION 10FT LAP (TUBING) ×2 IMPLANT

## 2013-05-13 NOTE — H&P (View-Only) (Signed)
[name], male   DOB: 1960/10/24, 52 y.o.   MRN: [id number]  Chief Complaint  Patient presents with  . New Evaluation    eval GB    HPI Phillip Miles is a 53 y.o. male.   HPI 52 yo WM referred by Mike Gip, PA, and Dr Leone Payor for evaluation of abdominal pain and probable biliary pancreatitis. The patient states he's had several episodes of epigastric pain radiating to his back. He describes the pain as sharp. He states that this lasted hours at a time. He states that one episode happened after eating spaghetti. Most episodes are associated with nausea and vomiting. He had one episode that was quite severe for which prompted him to go to the emergency room where he was evaluated. He was diagnosed with pancreatitis. [facility] referring providers notes he had a bilirubin of 3.1, alkaline phosphatase 173, AST 216 an ALT of 358. CBC was normal. Lipase was greater than 10,000. A CT scan was within normal limits and he was sent home on medical treatment. He states he had another flare this past Sunday with multiple episodes of vomiting and diarrhea along with fever and chills. He states that he just felt crummy. He didn't have a lot of abdominal pain per se. He had an abdominal ultrasound which revealed gallstones. It is felt that his pancreatitis is due to cholelithiasis. He denies any alcohol. He denies any new medications. Past Medical History  Diagnosis Date  . Depression     h/o, job related, resolved as of 2013  . GERD (gastroesophageal reflux disease)   . Elbow fracture     left  . Colon polyps   . Pancreatitis     History reviewed. No pertinent past surgical history.  Family History  Problem Relation Age of Onset  . Diabetes Mother   . Alcohol abuse Father   . Prostate cancer Father   . Heart disease Father   . Cancer Father     prostate    Social History History  Substance Use Topics  . Smoking status: Former Smoker -- 2.00 packs/day for 23 years    Types:  Cigarettes    Quit date: 09/08/1996  . Smokeless tobacco: Never Used  . Alcohol Use: Yes     Comment: rare    No Known Allergies  Current Outpatient Prescriptions  Medication Sig Dispense Refill  . acetaminophen (TYLENOL) 325 MG tablet Take 650 mg by mouth every 6 (six) hours as needed for pain.       Marland Kitchen ondansetron (ZOFRAN-ODT) 4 MG disintegrating tablet Take 4 mg by mouth 4 (four) times daily as needed for nausea.      Marland Kitchen oxyCODONE (OXY IR/ROXICODONE) 5 MG immediate release tablet Take 10 mg by mouth every 4 (four) hours as needed for pain.      . ranitidine (ZANTAC) 150 MG tablet Take 150 mg by mouth daily.       No current facility-administered medications for this visit.    Review of Systems Review of Systems  Constitutional: Positive for fever. Negative for chills, appetite change and unexpected weight change.  HENT: Negative for congestion and trouble swallowing.   Eyes: Negative for visual disturbance.  Respiratory: Negative for chest tightness and shortness of breath.   Cardiovascular: Negative for chest pain and leg swelling.       No PND, no orthopnea, no DOE  Gastrointestinal: Positive for nausea, vomiting, abdominal pain and diarrhea.       See HPI  Genitourinary: Negative for dysuria and hematuria.  Musculoskeletal: Negative.   Skin: Negative for rash.  Neurological: Positive for headaches. Negative for seizures and speech difficulty.  Hematological: Does not bruise/bleed easily.  Psychiatric/Behavioral: Negative for behavioral problems and confusion.    Blood pressure 130/82, pulse 76, resp. rate 14, height 6' (1.829 m), weight 278 lb 6.4 oz (126.281 kg).  Physical Exam Physical Exam  Vitals reviewed. Constitutional: He is oriented to person, place, and time. He appears well-developed and well-nourished. No distress.  HENT:  Head: Normocephalic and atraumatic.  Right Ear: External ear normal.  Left Ear: External ear normal.  Eyes: Conjunctivae are normal.  No scleral icterus.  Neck: Normal range of motion. Neck supple. No tracheal deviation present. No thyromegaly present.  Cardiovascular: Normal rate, normal heart sounds and intact distal pulses.   Pulmonary/Chest: Effort normal and breath sounds normal. No respiratory distress. He has no wheezes.  Abdominal: Soft. He exhibits no distension. There is no tenderness. There is no rebound and no guarding.  Musculoskeletal: Normal range of motion. He exhibits no edema and no tenderness.  Lymphadenopathy:    He has no cervical adenopathy.  Neurological: He is alert and oriented to person, place, and time. He exhibits normal muscle tone.  Skin: Skin is warm and dry. No rash noted. He is not diaphoretic. No erythema. No pallor.  Psychiatric: He has a normal mood and affect. His behavior is normal. Judgment and thought content normal.    Data Reviewed Esterwood's note 8/25 Labs 8/25 - nml cbc, cmet, lipase COMPLETE ABDOMINAL ULTRASOUND 05/02/13 Comparison: None.  Findings:  Gallbladder: Cholelithiasis. Stones which measure up to 1.7 cm.  No wall thickening or pericholecystic fluid. Sonographic Murphy's  sign was not elicited.  Common bile duct: Poorly visualized due to overlying bowel gas.  Upper normal to minimally dilated, 8 mm. Upper normal in this age  group 6 - 7 mm.  Liver: Increased in echogenicity.  IVC: Poorly visualized due to overlying bowel gas.  Pancreas: Poorly visualized due to overlying bowel gas.  Spleen: Normal in size and echogenicity.  Right Kidney: 10.8 cm. No hydronephrosis.  Left Kidney: 11.5 cm. No hydronephrosis.  Abdominal aorta: Nonaneurysmal without ascites. Partially  obscured proximally.  IMPRESSION:  1. Cholelithiasis without acute cholecystitis.  2. Borderline to minimal dilatation of the common duct. This is  partially obscured. Choledocholithiasis or other cause of distal  obstruction cannot be excluded. Consider contrast enhanced CT or  MRCP.  3.  Portions of anatomy obscured by overlying bowel gas.  4. Hepatic steatosis.  Assessment    Biliary pancreatitis Symptomatic Cholelithiasis     Plan    I believe the patient's symptoms are consistent with gallbladder disease.  We discussed gallbladder disease. The patient was given Agricultural engineer. We discussed non-operative and operative management. We discussed the signs & symptoms of acute cholecystitis  I discussed laparoscopic cholecystectomy with IOC in detail.  The patient was given educational material as well as diagrams detailing the procedure.  We discussed the risks and benefits of a laparoscopic cholecystectomy including, but not limited to bleeding, infection, injury to surrounding structures such as the intestine or liver, bile leak, retained gallstones, need to convert to an open procedure, prolonged diarrhea, blood clots such as  DVT, common bile duct injury, anesthesia risks, and possible need for additional procedures.  We discussed the typical post-operative recovery course. I explained that the likelihood of improvement of their symptoms is good.  The patient is currently scheduled for MRCP  on September 3. He is then scheduled for laparoscopic cholecystectomy for that Friday, September 5. The patient was told that if there were some abnormalities seen on his MRCP That it may change the sequence of events. My suspicion for persistent common bile duct stone is low since his LFTs have returned to normal. However I do think it's important to get the MRCP preoperatively. If there is a common bile duct stone still present he will need an ERCP prior to cholecystectomy and we may have to change our timing of his surgery  [name]. Andrey Campanile, MD, FACS General, Bariatric, & Minimally Invasive Surgery [facility] [place]          [facility] M 05/05/2013, 5:24 PM

## 2013-05-13 NOTE — Op Note (Signed)
Laparoscopic Cholecystectomy with IOC Procedure Note  Indications: This patient presents with symptomatic gallbladder disease and will undergo laparoscopic cholecystectomy. He had a normal preop MRCP which was done because of concerns of retained CBD stones.   52 yo WM referred by Mike Gip, PA, and Dr Leone Payor for evaluation of abdominal pain and probable biliary pancreatitis. The patient states he's had several episodes of epigastric pain radiating to his back. He describes the pain as sharp. He states that this lasted hours at a time. He states that one episode happened after eating spaghetti. Most episodes are associated with nausea and vomiting. He had one episode that was quite severe for which prompted him to go to the emergency room where he was evaluated. He was diagnosed with pancreatitis. [facility] referring providers notes he had a bilirubin of 3.1, alkaline phosphatase 173, AST 216 an ALT of 358. CBC was normal. Lipase was greater than 10,000. A CT scan was within normal limits and he was sent home on medical treatment. He states he had another flare this past Sunday with multiple episodes of vomiting and diarrhea along with fever and chills. He states that he just felt crummy. He didn't have a lot of abdominal pain per se. He had an abdominal ultrasound which revealed gallstones. It is felt that his pancreatitis is due to cholelithiasis. He denies any alcohol. He denies any new medications.   Pre-operative Diagnosis: chronic calculous cholecystitis, h/o biliary pancreatitis  Post-operative Diagnosis: Same  Surgeon: Atilano Ina   Assistants: none  Anesthesia: General endotracheal anesthesia  ASA Class: 2  Procedure Details  The patient was seen again in the Holding Room. The risks, benefits, complications, treatment options, and expected outcomes were discussed with the patient. The possibilities of reaction to medication, pulmonary aspiration, perforation of viscus, bleeding,  recurrent infection, finding a normal gallbladder, the need for additional procedures, failure to diagnose a condition, the possible need to convert to an open procedure, and creating a complication requiring transfusion or operation were discussed with the patient. The likelihood of improving the patient's symptoms with return to their baseline status is good.  The patient and/or family concurred with the proposed plan, giving informed consent. The site of surgery properly noted. The patient was taken to Operating Room, identified as TRAMPAS STETTNER and the procedure verified as Laparoscopic Cholecystectomy with Intraoperative Cholangiogram. A Time Out was held and the above information confirmed.  Prior to the induction of general anesthesia, antibiotic prophylaxis was administered. General endotracheal anesthesia was then administered and tolerated well. After the induction, the abdomen was prepped with Chloraprep and draped in the sterile fashion. The patient was positioned in the supine position.  Local anesthetic agent was injected into the skin near the umbilicus and an incision made just above the umbilicus. We dissected down to the abdominal fascia with blunt dissection.  The fascia was incised vertically and we entered the peritoneal cavity bluntly.  A pursestring suture of 0-Vicryl was placed around the fascial opening.  The Hasson cannula was inserted and secured with the stay suture.  Pneumoperitoneum was then created with CO2 and tolerated well without any adverse changes in the patient's vital signs. An 5-mm port was placed in the subxiphoid position.  Two 5-mm ports were placed in the right upper quadrant. All skin incisions were infiltrated with a local anesthetic agent before making the incision and placing the trocars.   We positioned the patient in reverse Trendelenburg, tilted slightly to the patient's left.  The gallbladder was  identified, the fundus grasped and retracted cephalad.  Adhesions were lysed bluntly and with the electrocautery where indicated, taking care not to injure any adjacent organs or viscus. The infundibulum was grasped and retracted laterally, exposing the peritoneum overlying the triangle of Calot. This was then divided and exposed in a blunt fashion. A critical view of the cystic duct and cystic artery was obtained.  The cystic duct was clearly identified and bluntly dissected circumferentially. The cystic duct was ligated with a clip distally.   An incision was made in the cystic duct and the [facility] cholangiogram catheter introduced. The catheter was secured using a clip. A cholangiogram was then obtained which showed good visualization of the distal and proximal biliary tree with no sign of filling defects or obstruction.  Contrast flowed easily into the duodenum. The catheter was then removed.   The cystic duct was then ligated with clips and divided. The cystic artery was identified, dissected free, ligated with clips and divided as well.   The gallbladder was dissected from the liver bed in retrograde fashion with the electrocautery. The gallbladder was entered because the posterior wall was adhered to the liver bed. There was some spillage of gallstones. These were quickly extracted after the 5 mm subxiphoid trocar was upsized to an 11 mm trocar. The gallbladder was removed and placed in an Endocatch sac.  There is a small pulsatile bleeder in the gallbladder bed. Hemostasis was achieved by placing a clip across it. The liver bed was irrigated and inspected. Hemostasis was achieved with the electrocautery. Copious irrigation was utilized and was repeatedly aspirated until clear. There were 2 additional gallstones that were extracted. The gallbladder and Endocatch sac were then removed through the umbilical port site.   The pursestring suture was used to close the umbilical fascia.    We again inspected the right upper quadrant for hemostasis.  The umbilical  closure was inspected and there was no air leak and nothing trapped within the closure. Pneumoperitoneum was released as we removed the trocars.  4-0 Monocryl was used to close the skin. The subxiphoid trocar site had some bleeding underneath the skin edges. There was a small arterial blood vessel. It was ligated and tied off. An additional 2-0 Vicryl suture was used for hemostasis in this area. 4 Monocryl was used to close the skin in a subcuticular fashion.  Dermabond was applied. The patient was then extubated and brought to the recovery room in stable condition. Instrument, sponge, and needle counts were correct at closure and at the conclusion of the case.   Findings: Chronic Cholecystitis with Cholelithiasis  Estimated Blood Loss: less than 50 mL         Drains: None         Specimens: Gallbladder           Complications: None; patient tolerated the procedure well.         Disposition: PACU - hemodynamically stable.         Condition: stable  [name]. Andrey Campanile, MD, FACS General, Bariatric, & Minimally Invasive Surgery [facility], [place]

## 2013-05-13 NOTE — Anesthesia Postprocedure Evaluation (Signed)
  Anesthesia Post-op Note  Patient: Phillip Miles  Procedure(s) Performed: Procedure(s) (LRB): LAPAROSCOPIC CHOLECYSTECTOMY WITH INTRAOPERATIVE CHOLANGIOGRAM (N/A)  Patient Location: PACU  Anesthesia Type: General  Level of Consciousness: awake and alert   Airway and Oxygen Therapy: Patient Spontanous Breathing  Post-op Pain: mild  Post-op Assessment: Post-op Vital signs reviewed, Patient's Cardiovascular Status Stable, Respiratory Function Stable, Patent Airway and No signs of Nausea or vomiting  Last Vitals:  Filed Vitals:   05/13/13 1609  BP: 158/84  Pulse: 59  Temp:   Resp: 14    Post-op Vital Signs: stable   Complications: No apparent anesthesia complications

## 2013-05-13 NOTE — Interval H&P Note (Signed)
History and Physical Interval Note:  05/13/2013 12:56 PM  Phillip Miles  has presented today for surgery, with the diagnosis of Symptomatic cholelithiasis , gallstones  The various methods of treatment have been discussed with the patient and family. After consideration of risks, benefits and other options for treatment, the patient has consented to  Procedure(s): LAPAROSCOPIC CHOLECYSTECTOMY WITH INTRAOPERATIVE CHOLANGIOGRAM (N/A) as a surgical intervention .  The patient's history has been reviewed, patient examined, no change in status, stable for surgery.  I have reviewed the patient's chart and labs.  Questions were answered to the patient's satisfaction.    [name]. Andrey Campanile, MD, FACS General, Bariatric, & Minimally Invasive Surgery [facility], [place]   [facility]

## 2013-05-13 NOTE — Anesthesia Preprocedure Evaluation (Addendum)
Anesthesia Evaluation  Patient identified by MRN, date of birth, ID band Patient awake    Reviewed: Allergy & Precautions, H&P , NPO status , Patient's Chart, lab work & pertinent test results  Airway Mallampati: II TM Distance: >3 FB Neck ROM: Full    Dental no notable dental hx. (+) Dental Advisory Given and Teeth Intact   Pulmonary former smoker,  breath sounds clear to auscultation  Pulmonary exam normal       Cardiovascular negative cardio ROS  Rhythm:Regular Rate:Normal     Neuro/Psych PSYCHIATRIC DISORDERS Depression negative neurological ROS     GI/Hepatic Neg liver ROS, GERD-  Medicated,  Endo/Other  negative endocrine ROS  Renal/GU negative Renal ROS     Musculoskeletal negative musculoskeletal ROS (+)   Abdominal (+) + obese,   Peds  Hematology negative hematology ROS (+)   Anesthesia Other Findings   Reproductive/Obstetrics                         Anesthesia Physical Anesthesia Plan  ASA: II  Anesthesia Plan: General   Post-op Pain Management:    Induction: Intravenous  Airway Management Planned: Oral ETT  Additional Equipment:   Intra-op Plan:   Post-operative Plan: Extubation in OR  Informed Consent: I have reviewed the patients History and Physical, chart, labs and discussed the procedure including the risks, benefits and alternatives for the proposed anesthesia with the patient or authorized representative who has indicated his/her understanding and acceptance.   Dental advisory given  Plan Discussed with: CRNA and Surgeon  Anesthesia Plan Comments:      Anesthesia Quick Evaluation

## 2013-05-13 NOTE — Preoperative (Signed)
Beta Blockers   Reason not to administer Beta Blockers:Not Applicable 

## 2013-05-13 NOTE — Transfer of Care (Signed)
Immediate Anesthesia Transfer of Care Note  Patient: Phillip Miles  Procedure(s) Performed: Procedure(s): LAPAROSCOPIC CHOLECYSTECTOMY WITH INTRAOPERATIVE CHOLANGIOGRAM (N/A)  Patient Location: PACU  Anesthesia Type:General  Level of Consciousness: awake, alert , oriented and patient cooperative  Airway & Oxygen Therapy: Patient Spontanous Breathing and Patient connected to face mask oxygen  Post-op Assessment: Report given to PACU RN, Post -op Vital signs reviewed and stable and Patient moving all extremities  Post vital signs: Reviewed and stable  Complications: No apparent anesthesia complications

## 2013-05-14 LAB — CBC
MCH: 29.3 pg (ref 26.0–34.0)
Platelets: 225 10*3/uL (ref 150–400)
RBC: 4.78 MIL/uL (ref 4.22–5.81)
WBC: 13.9 10*3/uL — ABNORMAL HIGH (ref 4.0–10.5)

## 2013-05-14 MED ORDER — OXYCODONE-ACETAMINOPHEN 5-325 MG PO TABS: 1.0000 | ORAL_TABLET | ORAL | Status: DC | PRN

## 2013-05-16 ENCOUNTER — Encounter (HOSPITAL_COMMUNITY): Payer: Self-pay | Admitting: General Surgery

## 2013-05-18 NOTE — Discharge Summary (Signed)
Physician Discharge Summary  Phillip Miles ZOX:[id number] DOB: 01-28-1961 DOA: 05/13/2013  PCP: Crawford Givens, MD  Admit date: 05/13/2013 Discharge date: 05/14/13  Recommendations for Outpatient Follow-up:    Follow-up Information   Follow up with Atilano Ina, MD On 06/08/2013. (8:45 AM)    Specialty:  General Surgery   Contact information:   [address] [place] [address] [phone]      Discharge Diagnoses:  Symptomatic cholelithiasis H/o biliary pancreatitis  Surgical Procedure: LAPAROSCOPIC CHOLECYSTECTOMY WITH IOC 05/13/13  Discharge Condition: good Disposition: home   Diet recommendation: low fat  Filed Weights   05/13/13 1700  Weight: 277 lb (125.646 kg)    Hospital Course:  The patient was brought to the hospital for a planned laparoscopic cholecystectomy with cholangiogram. He underwent the procedure without complication. He was kept overnight for observation. On postoperative day 1 he was deemed stable for discharge. His pain was controlled. His vital signs are stable. He was tolerating a diet. He was ambulating without difficulty. Discharge instructions have been discussed with the patient.   Discharge Instructions  Discharge Orders   Future Appointments Provider Department Dept Phone   06/08/2013 8:45 AM Atilano Ina, MD [facility], [place] [phone]   Future Orders Complete By Expires   Diet - low sodium heart healthy  As directed    Increase activity slowly  As directed        Medication List         acetaminophen 325 MG tablet  Commonly known as:  TYLENOL  Take 650 mg by mouth every 6 (six) hours as needed for pain.     ondansetron 4 MG disintegrating tablet  Commonly known as:  ZOFRAN-ODT  Take 4 mg by mouth 4 (four) times daily as needed for nausea.     oxyCODONE 5 MG immediate release tablet  Commonly known as:  Oxy IR/ROXICODONE  Take 10 mg by mouth every 4 (four) hours as needed for pain.     oxyCODONE-acetaminophen 5-325 MG per tablet  Commonly known as:  PERCOCET/ROXICET  Take 1-2 tablets by mouth every 4 (four) hours as needed.     ranitidine 150 MG tablet  Commonly known as:  ZANTAC  Take 150 mg by mouth daily.           Follow-up Information   Follow up with Atilano Ina, MD On 06/08/2013. (8:45 AM)    Specialty:  General Surgery   Contact information:   [address] [place] [address] [phone]        The results of significant diagnostics from this hospitalization (including imaging, microbiology, ancillary and laboratory) are listed below for reference.    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  Findings:  Water's views with eyes deviated toward the left and toward the right were obtained.  There is no intraorbital radiopaque foreign body.  No fracture or dislocation.  Paranasal sinuses are clear.  There is a bony spur along the left mid nasal septum.  IMPRESSION: No demonstrable intraorbital radiopaque foreign body.   Original Report Authenticated By: Bretta Bang, M.D.   Dg Cholangiogram Operative  05/13/2013   *RADIOLOGY REPORT*  Clinical Data: Cholelithiasis  INTRAOPERATIVE CHOLANGIOGRAM  Technique:  Multiple fluoroscopic spot radiographs were obtained during intraoperative cholangiogram and are submitted for interpretation post-operatively.  Comparison: MRCP 05/11/2013  Findings: No persistent filling defects in the common duct. Intrahepatic ducts are incompletely visualized, appearing decompressed centrally. Contrast passes into the duodenum.  IMPRESSION  Negative for retained common duct stone.   Original Report Authenticated By: D. Andria Rhein, MD   US Abdomen Complete  05/03/2013   *RADIOLOGY REPORT*  Clinical Data:  Pancreatitis.  Rule out common duct stone.  COMPLETE ABDOMINAL  ULTRASOUND  Comparison:  None.  Findings:  Gallbladder:  Cholelithiasis.  Stones which measure up to 1.7 cm. No wall thickening or pericholecystic fluid. Sonographic Murphy's sign was not elicited.  Common bile duct: Poorly visualized due to overlying bowel gas. Upper normal to minimally dilated, 8 mm.  Upper normal in this age group 6 - 7 mm.  Liver: Increased in echogenicity.  IVC: Poorly visualized due to overlying bowel gas.  Pancreas:  Poorly visualized due to overlying bowel gas.  Spleen:  Normal in size and echogenicity.  Right Kidney:  10.8 cm. No hydronephrosis.  Left Kidney:  11.5 cm. No hydronephrosis.  Abdominal aorta:  Nonaneurysmal without ascites.  Partially obscured proximally.  IMPRESSION:  1.  Cholelithiasis without acute cholecystitis. 2.  Borderline to minimal dilatation of the common duct.  This is partially obscured.  Choledocholithiasis or other cause of distal obstruction cannot be excluded.  Consider contrast enhanced CT or MRCP. 3. Portions of anatomy obscured by overlying bowel gas. 4.  Hepatic steatosis.   Original Report Authenticated By: Jeronimo Greaves, M.D.   Mr 3d Recon At Scanner  05/11/2013   *RADIOLOGY REPORT*  Clinical Data:  Cholelithiasis with recent pancreatitis.  Evaluate for choledocholithiasis.  MRI ABDOMEN WITHOUT AND WITH CONTRAST (INCLUDING MRCP)  Technique:  Multiplanar multisequence MR imaging of the abdomen was performed both before and after the administration of intravenous contrast. Heavily T2-weighted images of the biliary and pancreatic ducts were obtained, and three-dimensional MRCP images were rendered by post processing.  Contrast: 20mL MULTIHANCE GADOBENATE DIMEGLUMINE 529 MG/ML IV SOLN  Comparison:  Ultrasound abdomen dated 05/03/2013  Findings:  Liver is within normal limits.  No suspicious/enhancing hepatic lesions.  No hepatic steatosis.  Spleen and adrenal glands are unremarkable.  Pancreas is within normal limits.  No peripancreatic inflammatory changes.   No evidence of pancreatic divisum.  Multiple layering gallstones, without associate inflammatory changes.  No intrahepatic or extrahepatic ductal dilatation. Common duct measures 4 mm.  No choledocholithiasis is seen.  Kidneys are within normal limits.  No hydronephrosis.  No abdominal ascites.  No suspicious abdominal lymphadenopathy.  No focal osseous lesions.  IMPRESSION: Cholelithiasis without evidence of acute cholecystitis.  No choledocholithiasis is seen.  No peripancreatic inflammatory changes.  No evidence of pancreatic divisum.   Original Report Authenticated By: Charline Bills, M.D.   Mr Abd W/wo Cm/mrcp  05/11/2013   *RADIOLOGY REPORT*  Clinical Data:  Cholelithiasis with recent pancreatitis.  Evaluate for choledocholithiasis.  MRI ABDOMEN WITHOUT AND WITH CONTRAST (INCLUDING MRCP)  Technique:  Multiplanar multisequence MR imaging of the abdomen was performed both before and after the administration of intravenous contrast. Heavily T2-weighted images of the biliary and pancreatic ducts were obtained, and three-dimensional MRCP images were rendered by post processing.  Contrast: 20mL MULTIHANCE GADOBENATE DIMEGLUMINE 529 MG/ML IV SOLN  Comparison:  Ultrasound abdomen dated 05/03/2013  Findings:  Liver is within normal limits.  No suspicious/enhancing hepatic lesions.  No hepatic steatosis.  Spleen and adrenal glands are unremarkable.  Pancreas is within normal limits.  No peripancreatic inflammatory changes.  No evidence of pancreatic  divisum.  Multiple layering gallstones, without associate inflammatory changes.  No intrahepatic or extrahepatic ductal dilatation. Common duct measures 4 mm.  No choledocholithiasis is seen.  Kidneys are within normal limits.  No hydronephrosis.  No abdominal ascites.  No suspicious abdominal lymphadenopathy.  No focal osseous lesions.  IMPRESSION: Cholelithiasis without evidence of acute cholecystitis.  No choledocholithiasis is seen.  No peripancreatic inflammatory  changes.  No evidence of pancreatic divisum.   Original Report Authenticated By: Charline Bills, M.D.     Labs:   Recent Labs Lab 05/14/13 0455  WBC 13.9*  HGB 14.0  HCT 41.5  MCV 86.8  PLT 225    Time coordinating discharge: 10 minutes  Signed:  Atilano Ina, MD [facility], [place] [phone] 05/18/2013, 11:32 AM

## 2013-06-08 ENCOUNTER — Encounter (INDEPENDENT_AMBULATORY_CARE_PROVIDER_SITE_OTHER): Payer: Self-pay | Admitting: General Surgery

## 2013-06-08 ENCOUNTER — Ambulatory Visit (INDEPENDENT_AMBULATORY_CARE_PROVIDER_SITE_OTHER): Payer: 59 | Admitting: General Surgery

## 2013-06-08 VITALS — BP 142/76 | HR 72 | Resp 16 | Ht 72.0 in | Wt 283.0 lb

## 2013-06-08 DIAGNOSIS — Z09 Encounter for follow-up examination after completed treatment for conditions other than malignant neoplasm: Secondary | ICD-10-CM

## 2013-06-08 NOTE — Patient Instructions (Signed)
Call if any questions Wash belly button daily. Apply hydrogen peroxide on a q-tip to help clean wound

## 2013-06-08 NOTE — Progress Notes (Signed)
Subjective:     [name], male   DOB: 01/25/1961, 52 y.o.   MRN: [id number]  HPI 52 year old Caucasian male comes in for followup after undergoing laparoscopic cholecystectomy with cholangiogram on September 5 for a history of biliary pancreatitis. He did quite well. He denies any fever, chills, nausea, vomiting, diarrhea or constipation. He denies any persistent abdominal pain. He reports a good appetite and good energy level. He has returned to work.  Review of Systems     Objective:   Physical Exam BP 142/76  Pulse 72  Resp 16  Ht 6' (1.829 m)  Wt 283 lb (128.368 kg)  BMI 38.37 kg/m2  Gen: alert, NAD, non-toxic appearing Pupils: equal, no scleral icterus Pulm: Lungs clear to auscultation, symmetric chest rise CV: regular rate and rhythm Abd: soft, nontender, nondistended. Well-healed trocar sites except for umbilical site- small gap b/t skin edges in midportion of incision.  No cellulitis. No incisional hernia Ext: no edema, no calf tenderness Skin: no rash, no jaundice     Assessment:     S/p Lap chole with IOC for chronic cholecystitis and cholelithiasis     Plan:     Overall he is doing good. The incision in his umbilicus it is not infected. I encouraged him to clean the wound daily and that it should continue to close up with time. We discussed his pathology report and he was given a copy of it. I have released him to full activities. I offered him a followup appointment for a wound check however he declined. Followup as needed  [name]. Andrey Campanile, MD, FACS General, Bariatric, & Minimally Invasive Surgery [facility], [place]

## 2014-07-09 DIAGNOSIS — G4733 Obstructive sleep apnea (adult) (pediatric): Secondary | ICD-10-CM | POA: Insufficient documentation

## 2014-07-09 HISTORY — DX: Obstructive sleep apnea (adult) (pediatric): G47.33

## 2014-07-11 LAB — LIPID PANEL
Cholesterol: 167
HDL: 28 mg/dL — AB (ref 35–70)
LDL (calc): 104
Triglycerides: 175

## 2014-07-11 LAB — CBC
HGB: 14.5 g/dL
PLATELET COUNT: 211
WBC: 8.9

## 2014-07-11 LAB — PSA: PSA: 0.9

## 2014-07-11 LAB — COMPREHENSIVE METABOLIC PANEL
ALT: 23
AST: 23 U/L
Albumin: 4.2
Alkaline Phosphatase: 104 U/L
Bilirubin, Total: 0.5
CREATININE: 1.14
GLUCOSE: 98
POTASSIUM: 5.1 mmol/L
SODIUM: 140

## 2014-07-11 LAB — TSH: TSH: 2.33

## 2014-07-19 IMAGING — CR DG ORBITS FOR FOREIGN BODY
2 series · 2 of 2 positions shown · non-contrast
Comparison: None.

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

ORBITS FOR FOREIGN BODY - 2 VIEW

[w waters (1 of 2)]
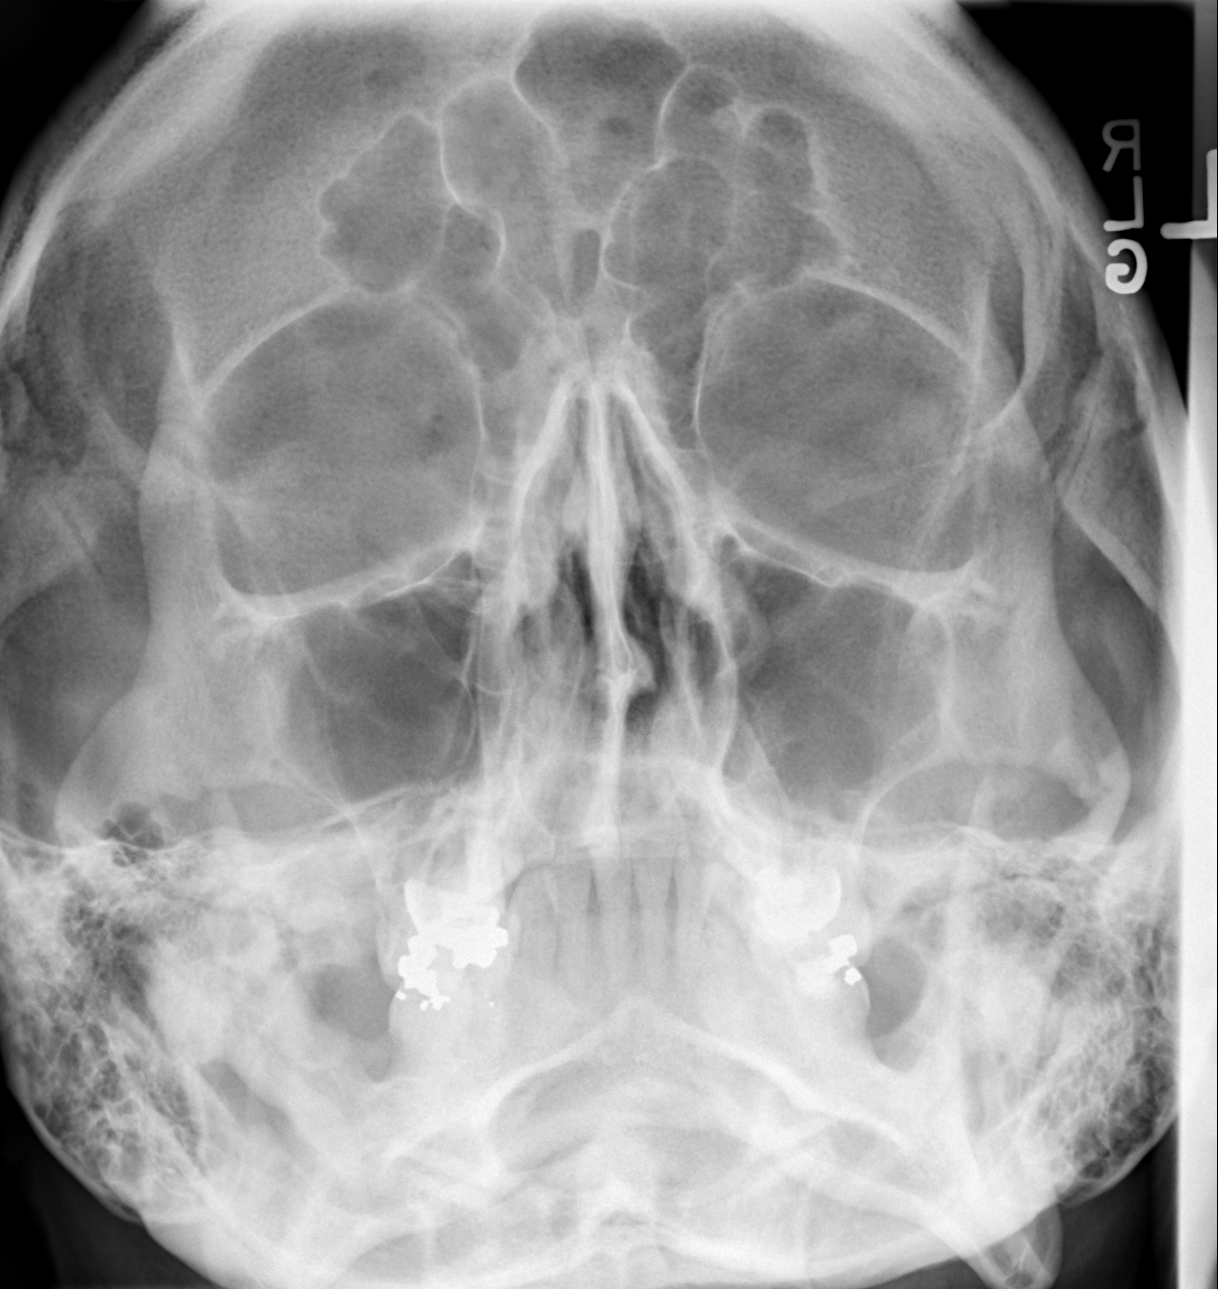

[w waters (2 of 2)]
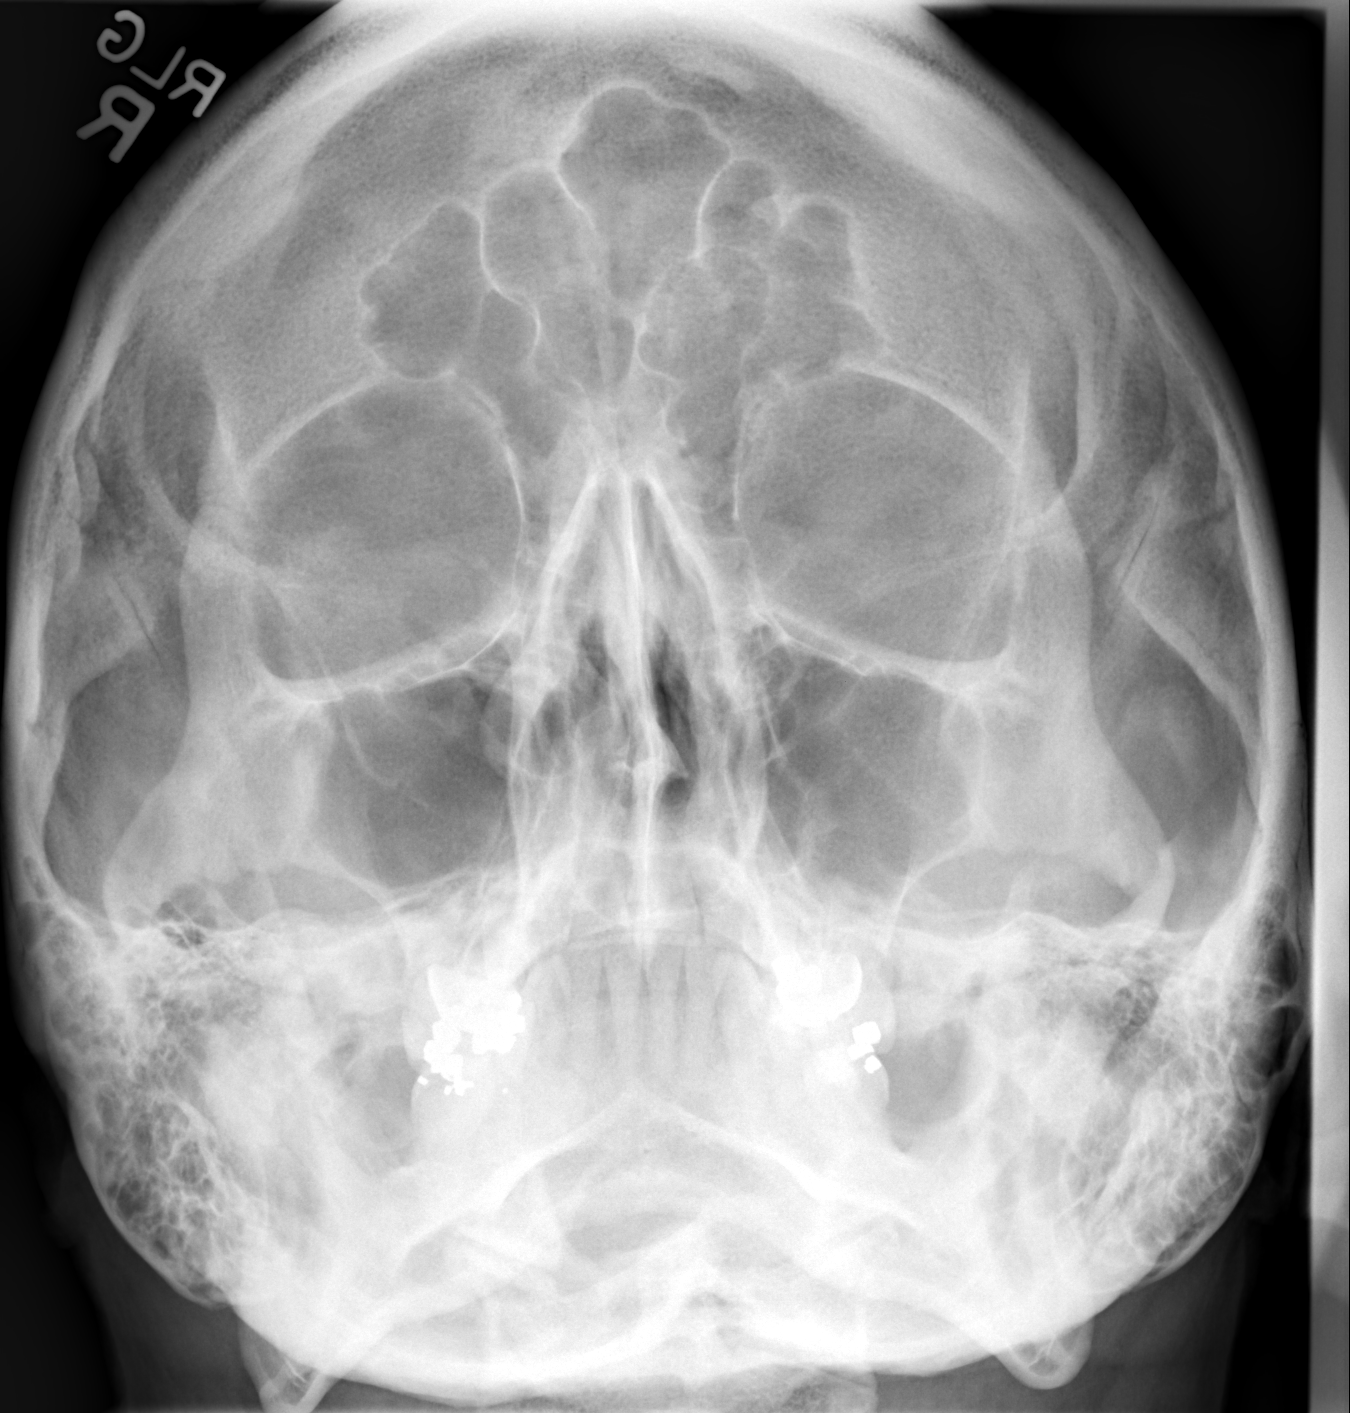

[2 of 2 positions shown; findings below may reference images not displayed]

FINDINGS: Water's views with eyes deviated toward the left and
toward the right were obtained.  There is no intraorbital
radiopaque foreign body.  No fracture or dislocation.  Paranasal
sinuses are clear.  There is a bony spur along the left mid nasal
septum.
IMPRESSION: No demonstrable intraorbital radiopaque foreign body.

## 2015-10-01 ENCOUNTER — Ambulatory Visit: Payer: 59 | Admitting: Podiatry

## 2016-07-15 ENCOUNTER — Encounter: Payer: Self-pay | Admitting: Family Medicine

## 2016-07-15 ENCOUNTER — Ambulatory Visit (INDEPENDENT_AMBULATORY_CARE_PROVIDER_SITE_OTHER): Payer: 59 | Admitting: Family Medicine

## 2016-07-15 VITALS — BP 148/98 | HR 72 | Temp 98.5°F | Ht 72.0 in | Wt 301.0 lb

## 2016-07-15 DIAGNOSIS — R030 Elevated blood-pressure reading, without diagnosis of hypertension: Secondary | ICD-10-CM | POA: Diagnosis not present

## 2016-07-15 DIAGNOSIS — E66811 Obesity, class 1: Secondary | ICD-10-CM | POA: Insufficient documentation

## 2016-07-15 DIAGNOSIS — Z0000 Encounter for general adult medical examination without abnormal findings: Secondary | ICD-10-CM

## 2016-07-15 DIAGNOSIS — I10 Essential (primary) hypertension: Secondary | ICD-10-CM | POA: Insufficient documentation

## 2016-07-15 DIAGNOSIS — Z23 Encounter for immunization: Secondary | ICD-10-CM

## 2016-07-15 DIAGNOSIS — E669 Obesity, unspecified: Secondary | ICD-10-CM | POA: Insufficient documentation

## 2016-07-15 DIAGNOSIS — K219 Gastro-esophageal reflux disease without esophagitis: Secondary | ICD-10-CM | POA: Diagnosis not present

## 2016-07-15 MED ORDER — OMEPRAZOLE 40 MG PO CPDR: 40.0000 mg | DELAYED_RELEASE_CAPSULE | Freq: Every day | ORAL | 1 refills | Status: DC

## 2016-07-15 NOTE — Assessment & Plan Note (Signed)
Stop zantac. Start omeprazole 40mg  daily x3 wks then PRN.

## 2016-07-15 NOTE — Assessment & Plan Note (Signed)
Discussed healthy diet and lifestyle changes to affect sustainable weight loss  

## 2016-07-15 NOTE — Assessment & Plan Note (Addendum)
Preventative protocols reviewed and updated unless pt declined. Discussed healthy diet and lifestyle.  Forgot DRE - will discuss at f/u visit

## 2016-07-15 NOTE — Progress Notes (Signed)
BP (!) 148/98   Pulse 72   Temp 98.5 F (36.9 C) (Oral)   Ht 6' (1.829 m)   Wt (!) 301 lb (136.5 kg)   BMI 40.82 kg/m    CC: re establish care Subjective:    [name], male    DOB: [birth year], 55 y.o.   MRN: [id number]  HPI: SELIG STEUER is a 55 y.o. male presenting on 07/15/2016 for Establish Care   On recheck bp 180/100.  Last seen here 2013. Prior saw Dr Minna Antis - records brought today. Prior saw Dr Council Mechanic. Prior on norvasc, not recently.   GERD - takes zantac. Occasionally wakes up choking - worse with late meals. No nausea/vomiting, dysphagia, early satiety.   Preventative: COLONOSCOPY 01/2012; HP, rpt 10 yrs Carlean Purl) Prostate screening - will do yearly given family history  Flu shot yearly Td 2006 Seat belt use discussed Sunscreen use discussed. 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 Interim medical history since our last visit reviewed. Allergies and medications reviewed and updated. Current Outpatient Prescriptions on File Prior to Visit  Medication Sig  . acetaminophen (TYLENOL) 325 MG tablet Take 650 mg by mouth every 6 (six) hours as needed for pain.    No current facility-administered medications on file prior to visit.     Review of Systems  Constitutional: Negative for activity change, appetite change, chills, fatigue, fever and unexpected weight change.  HENT: Negative for hearing loss.   Eyes: Negative for visual disturbance.  Respiratory: Negative for cough, chest tightness, shortness of breath and wheezing.   Cardiovascular: Negative for chest pain, palpitations and leg swelling.  Gastrointestinal: Positive for  abdominal pain and constipation (mild). Negative for abdominal distention, blood in stool, diarrhea, nausea and vomiting.       GERD related  Genitourinary: Negative for difficulty urinating and hematuria.  Musculoskeletal: Positive for arthralgias. Negative for myalgias and neck pain.  Skin: Negative for rash.  Neurological: Negative for dizziness, seizures, syncope and headaches.  Hematological: Negative for adenopathy. Does not bruise/bleed easily.  Psychiatric/Behavioral: Negative for dysphoric mood. The patient is not nervous/anxious.    Per HPI unless specifically indicated in ROS section     Objective:    BP (!) 148/98   Pulse 72   Temp 98.5 F (36.9 C) (Oral)   Ht 6' (1.829 m)   Wt (!) 301 lb (136.5 kg)   BMI 40.82 kg/m   Wt Readings from Last 3 Encounters:  07/15/16 (!) 301 lb (136.5 kg)  06/08/13 283 lb (128.4 kg)  05/13/13 277 lb (125.6 kg)    Physical Exam  Constitutional: He is oriented to person, place, and time. He appears well-developed and well-nourished. No distress.  HENT:  Head: Normocephalic and atraumatic.  Right Ear: Hearing, tympanic membrane, external ear and ear canal normal.  Left Ear: Hearing, tympanic membrane, external ear and ear canal normal.  Nose: Nose normal.  Mouth/Throat: Uvula is midline, oropharynx is clear and moist and mucous membranes are normal. No oropharyngeal exudate, posterior oropharyngeal edema or posterior oropharyngeal erythema.  Eyes: Conjunctivae and EOM are normal. Pupils are equal, round, and reactive to light. No scleral icterus.  Neck: Normal range of motion. Neck supple. No thyromegaly present.  Cardiovascular: Normal rate, regular rhythm, normal heart sounds and intact distal pulses.   No murmur heard. Pulses:      Radial pulses are 2+ on the right side, and 2+ on the left side.  Pulmonary/Chest: Effort normal and breath sounds normal. No respiratory distress. He has no wheezes. He has no rales.  Abdominal: Soft.  Bowel sounds are normal. He exhibits no distension and no mass. There is no tenderness. There is no rebound and no guarding.  Musculoskeletal: Normal range of motion. He exhibits no edema.  Lymphadenopathy:    He has no cervical adenopathy.  Neurological: He is alert and oriented to person, place, and time.  CN grossly intact, station and gait intact  Skin: Skin is warm and dry. No rash noted.  Psychiatric: He has a normal mood and affect. His behavior is normal. Judgment and thought content normal.  Nursing note and vitals reviewed.     Assessment & Plan:   Problem List Items Addressed This Visit    Elevated blood pressure reading without diagnosis of hypertension    Marked elevation today. Discussed lowering bp through healthy diet and lifestyle changes. Pt will monitor at home and drop of bp log in 2 wks. RTC 1 mo f/u visit. Low threshold to start antihypertensive.       Relevant Orders   Comprehensive metabolic panel   TSH   GERD (gastroesophageal reflux disease)    Stop zantac. Start omeprazole 40mg  daily x3 wks then PRN.       Relevant Medications   omeprazole (PRILOSEC) 40 MG capsule   Obesity, morbid, BMI 40.0-49.9 (HCC)    Discussed healthy diet and lifestyle changes to affect sustainable weight loss.      Relevant Orders   Lipid panel   Comprehensive metabolic panel   TSH   Routine general medical examination at a health care facility - Primary    Preventative protocols reviewed and updated unless pt declined. Discussed healthy diet and lifestyle.  Forgot DRE - will discuss at f/u visit       Other Visit Diagnoses    Need for influenza vaccination       Relevant Orders   Flu Vaccine QUAD 36+ mos PF IM (Fluarix & Fluzone Quad PF)       Follow up plan: Return in about 1 year (around 07/15/2017) for annual exam, prior fasting for blood work.  Ria Bush, MD

## 2016-07-15 NOTE — Patient Instructions (Addendum)
Flu shot today Return as needed or in 1 month for follow up hypertension Return at your convenience for fasting labs.  Your goal blood pressure is <140/90. Drop off log of blood pressures in 2 weeks.  Work on low salt/sodium diet - goal <1.5gm (1,500mg ) per day. Eat a diet high in fruits/vegetables and whole grains.  Look into mediterranean diet.  Goal activity is 169min/wk of moderate intensity exercise.  This can be split into 30 minute chunks.  If you are not at this level, you can start with smaller 10-15 min increments and slowly build up activity. Look at [place].org for more resources   Health Maintenance, Male A healthy lifestyle and preventative care can promote health and wellness.  Maintain regular health, dental, and eye exams.  Eat a healthy diet. Foods like vegetables, fruits, whole grains, low-fat dairy products, and lean protein foods contain the nutrients you need and are low in calories. Decrease your intake of foods high in solid fats, added sugars, and salt. Get information about a proper diet from your health care provider, if necessary.  Regular physical exercise is one of the most important things you can do for your health. Most adults should get at least 150 minutes of moderate-intensity exercise (any activity that increases your heart rate and causes you to sweat) each week. In addition, most adults need muscle-strengthening exercises on 2 or more days a week.   Maintain a healthy weight. The body mass index (BMI) is a screening tool to identify possible weight problems. It provides an estimate of body fat based on height and weight. Your health care provider can find your BMI and can help you achieve or maintain a healthy weight. For males 20 years and older:  A BMI below 18.5 is considered underweight.  A BMI of 18.5 to 24.9 is normal.  A BMI of 25 to 29.9 is considered overweight.  A BMI of 30 and above is considered obese.  Maintain normal blood lipids and  cholesterol by exercising and minimizing your intake of saturated fat. Eat a balanced diet with plenty of fruits and vegetables. Blood tests for lipids and cholesterol should begin at age 21 and be repeated every 5 years. If your lipid or cholesterol levels are high, you are over age 47, or you are at high risk for heart disease, you may need your cholesterol levels checked more frequently.Ongoing high lipid and cholesterol levels should be treated with medicines if diet and exercise are not working.  If you smoke, find out from your health care provider how to quit. If you do not use tobacco, do not start.  Lung cancer screening is recommended for adults aged 15-80 years who are at high risk for developing lung cancer because of a history of smoking. A yearly low-dose CT scan of the lungs is recommended for people who have at least a 30-pack-year history of smoking and are current smokers or have quit within the past 15 years. A pack year of smoking is smoking an average of 1 pack of cigarettes a day for 1 year (for example, a 30-pack-year history of smoking could mean smoking 1 pack a day for 30 years or 2 packs a day for 15 years). Yearly screening should continue until the smoker has stopped smoking for at least 15 years. Yearly screening should be stopped for people who develop a health problem that would prevent them from having lung cancer treatment.  If you choose to drink alcohol, do not have more  than 2 drinks per day. One drink is considered to be 12 oz (360 mL) of beer, 5 oz (150 mL) of wine, or 1.5 oz (45 mL) of liquor.  Avoid the use of street drugs. Do not share needles with anyone. Ask for help if you need support or instructions about stopping the use of drugs.  High blood pressure causes heart disease and increases the risk of stroke. High blood pressure is more likely to develop in:  People who have blood pressure in the end of the normal range (100-139/85-89 mm Hg).  People who are  overweight or obese.  People who are African American.  If you are 39-5 years of age, have your blood pressure checked every 3-5 years. If you are 52 years of age or older, have your blood pressure checked every year. You should have your blood pressure measured twice--once when you are at a hospital or clinic, and once when you are not at a hospital or clinic. Record the average of the two measurements. To check your blood pressure when you are not at a hospital or clinic, you can use:  An automated blood pressure machine at a pharmacy.  A home blood pressure monitor.  If you are 16-8 years old, ask your health care provider if you should take aspirin to prevent heart disease.  Diabetes screening involves taking a blood sample to check your fasting blood sugar level. This should be done once every 3 years after age 12 if you are at a normal weight and without risk factors for diabetes. Testing should be considered at a younger age or be carried out more frequently if you are overweight and have at least 1 risk factor for diabetes.  Colorectal cancer can be detected and often prevented. Most routine colorectal cancer screening begins at the age of 27 and continues through age 30. However, your health care provider may recommend screening at an earlier age if you have risk factors for colon cancer. On a yearly basis, your health care provider may provide home test kits to check for hidden blood in the stool. A small camera at the end of a tube may be used to directly examine the colon (sigmoidoscopy or colonoscopy) to detect the earliest forms of colorectal cancer. Talk to your health care provider about this at age 85 when routine screening begins. A direct exam of the colon should be repeated every 5-10 years through age 16, unless early forms of precancerous polyps or small growths are found.  People who are at an increased risk for hepatitis B should be screened for this virus. You are  considered at high risk for hepatitis B if:  You were born in a country where hepatitis B occurs often. Talk with your health care provider about which countries are considered high risk.  Your parents were born in a high-risk country and you have not received a shot to protect against hepatitis B (hepatitis B vaccine).  You have HIV or AIDS.  You use needles to inject street drugs.  You live with, or have sex with, someone who has hepatitis B.  You are a man who has sex with other men (MSM).  You get hemodialysis treatment.  You take certain medicines for conditions like cancer, organ transplantation, and autoimmune conditions.  Hepatitis C blood testing is recommended for all people born from [birth year] through 1965 and any individual with known risk factors for hepatitis C.  Healthy men should no longer receive prostate-specific antigen (PSA)  blood tests as part of routine cancer screening. Talk to your health care provider about prostate cancer screening.  Testicular cancer screening is not recommended for adolescents or adult males who have no symptoms. Screening includes self-exam, a health care provider exam, and other screening tests. Consult with your health care provider about any symptoms you have or any concerns you have about testicular cancer.  Practice safe sex. Use condoms and avoid high-risk sexual practices to reduce the spread of sexually transmitted infections (STIs).  You should be screened for STIs, including gonorrhea and chlamydia if:  You are sexually active and are younger than 24 years.  You are older than 24 years, and your health care provider tells you that you are at risk for this type of infection.  Your sexual activity has changed since you were last screened, and you are at an increased risk for chlamydia or gonorrhea. Ask your health care provider if you are at risk.  If you are at risk of being infected with HIV, it is recommended that you take a  prescription medicine daily to prevent HIV infection. This is called pre-exposure prophylaxis (PrEP). You are considered at risk if:  You are a man who has sex with other men (MSM).  You are a heterosexual man who is sexually active with multiple partners.  You take drugs by injection.  You are sexually active with a partner who has HIV.  Talk with your health care provider about whether you are at high risk of being infected with HIV. If you choose to begin PrEP, you should first be tested for HIV. You should then be tested every 3 months for as long as you are taking PrEP.  Use sunscreen. Apply sunscreen liberally and repeatedly throughout the day. You should seek shade when your shadow is shorter than you. Protect yourself by wearing long sleeves, pants, a wide-brimmed hat, and sunglasses year round whenever you are outdoors.  Tell your health care provider of new moles or changes in moles, especially if there is a change in shape or color. Also, tell your health care provider if a mole is larger than the size of a pencil eraser.  A one-time screening for abdominal aortic aneurysm (AAA) and surgical repair of large AAAs by ultrasound is recommended for men aged 14-75 years who are current or former smokers.  Stay current with your vaccines (immunizations).   This information is not intended to replace advice given to you by your health care provider. Make sure you discuss any questions you have with your health care provider.   Document Released: 02/21/2008 Document Revised: 09/15/2014 Document Reviewed: 01/20/2011 Elsevier Interactive Patient Education [employer].

## 2016-07-15 NOTE — Assessment & Plan Note (Signed)
Marked elevation today. Discussed lowering bp through healthy diet and lifestyle changes. Pt will monitor at home and drop of bp log in 2 wks. RTC 1 mo f/u visit. Low threshold to start antihypertensive.

## 2016-07-15 NOTE — Progress Notes (Signed)
Pre visit review using our clinic review tool, if applicable. No additional management support is needed unless otherwise documented below in the visit note. 

## 2016-07-16 ENCOUNTER — Encounter: Payer: Self-pay | Admitting: Family Medicine

## 2016-07-16 ENCOUNTER — Other Ambulatory Visit (INDEPENDENT_AMBULATORY_CARE_PROVIDER_SITE_OTHER): Payer: 59

## 2016-07-16 ENCOUNTER — Encounter: Payer: Self-pay | Admitting: *Deleted

## 2016-07-16 DIAGNOSIS — R030 Elevated blood-pressure reading, without diagnosis of hypertension: Secondary | ICD-10-CM

## 2016-07-16 LAB — COMPREHENSIVE METABOLIC PANEL
ALBUMIN: 4 g/dL (ref 3.5–5.2)
ALK PHOS: 87 U/L (ref 39–117)
ALT: 24 U/L (ref 0–53)
AST: 19 U/L (ref 0–37)
BILIRUBIN TOTAL: 0.5 mg/dL (ref 0.2–1.2)
BUN: 19 mg/dL (ref 6–23)
CALCIUM: 9.2 mg/dL (ref 8.4–10.5)
CO2: 29 meq/L (ref 19–32)
CREATININE: 1.21 mg/dL (ref 0.40–1.50)
Chloride: 105 mEq/L (ref 96–112)
GFR: 66.16 mL/min (ref >60.00)
Glucose, Bld: 105 mg/dL — ABNORMAL HIGH (ref 70–99)
Potassium: 4.5 mEq/L (ref 3.5–5.1)
Sodium: 140 mEq/L (ref 135–145)
TOTAL PROTEIN: 7.2 g/dL (ref 6.0–8.3)

## 2016-07-16 LAB — LIPID PANEL
CHOL/HDL RATIO: 6
CHOLESTEROL: 175 mg/dL (ref 0–200)
HDL: 27.1 mg/dL — AB (ref >39.00)
LDL Cholesterol: 118 mg/dL — ABNORMAL HIGH (ref 0–99)
NonHDL: 147.63
TRIGLYCERIDES: 150 mg/dL — AB (ref 0.0–149.0)
VLDL: 30 mg/dL (ref 0.0–40.0)

## 2016-07-16 LAB — TSH: TSH: 1.88 u[IU]/mL (ref 0.35–4.50)

## 2016-07-17 ENCOUNTER — Encounter: Payer: Self-pay | Admitting: *Deleted

## 2016-07-21 ENCOUNTER — Encounter: Payer: Self-pay | Admitting: Family Medicine

## 2016-08-06 ENCOUNTER — Telehealth: Payer: Self-pay | Admitting: Family Medicine

## 2016-08-06 MED ORDER — AMLODIPINE BESYLATE 5 MG PO TABS
5.0000 mg | ORAL_TABLET | Freq: Every day | ORAL | 3 refills | Status: DC
Start: 2016-08-06 — End: 2016-08-26

## 2016-08-06 NOTE — Telephone Encounter (Signed)
Patient notified and verbalized understanding. 

## 2016-08-06 NOTE — Telephone Encounter (Signed)
plz notify I reviewed bp log he brought yesterday - bp averaging too high although last few readings were some better. rec start low dose amlodipine sent to pharmacy, keep f/u appt next month.

## 2016-08-26 ENCOUNTER — Ambulatory Visit (INDEPENDENT_AMBULATORY_CARE_PROVIDER_SITE_OTHER): Payer: 59 | Admitting: Family Medicine

## 2016-08-26 ENCOUNTER — Encounter: Payer: Self-pay | Admitting: Family Medicine

## 2016-08-26 VITALS — BP 132/90 | HR 76 | Wt 302.8 lb

## 2016-08-26 DIAGNOSIS — I10 Essential (primary) hypertension: Secondary | ICD-10-CM

## 2016-08-26 MED ORDER — AMLODIPINE BESYLATE 5 MG PO TABS: 5.0000 mg | ORAL_TABLET | Freq: Every day | ORAL | 3 refills | Status: DC

## 2016-08-26 NOTE — Assessment & Plan Note (Addendum)
Improved with addition of amlodipine 5mg  daily. Continue. Recheck 6 mo f/u visit.  Reviewed healthy diet and lifestyle changes to keep bp under control. Baseline EKG today.

## 2016-08-26 NOTE — Patient Instructions (Signed)
Blood pressure looking better - continue amlodipine.  Return as needed or 6 months for blood pressure check.

## 2016-08-26 NOTE — Addendum Note (Signed)
Addended by: Inocencio Homes on: 08/26/2016 09:28 AM   Modules accepted: Orders

## 2016-08-26 NOTE — Progress Notes (Signed)
BP 132/90   Pulse 76   Wt (!) 302 lb 12.8 oz (137.3 kg)   SpO2 97%   BMI 41.07 kg/m    CC: f/u visit Subjective:    [name], male    DOB: 08-Jun-1961, 55 y.o.   MRN: [id number]  HPI: Phillip Miles is a 55 y.o. male presenting on 08/26/2016 for Follow-up   See prior note for details. Seen here for CPE last month - bp elevated. Discussed diet and lifestyle changes to control blood pressure - here for 1 mo f/u visit. Due to persistently elevated blood pressures, we started amlodipine 5mg  daily. Tolerating well. No HA, vision changes, CP/tightness, SOB, leg swelling.   Trying to avoid salt, drinking water, good fruits/vegetables and whole grains.  He has started stationary bicycle  fmhx prostate cancer - will do DRE today. Recent PSA WNL. Endorses nocturia 2-3x/night.   Relevant past medical, surgical, family and social history reviewed and updated as indicated. Interim medical history since our last visit reviewed. Allergies and medications reviewed and updated. Current Outpatient Prescriptions on File Prior to Visit  Medication Sig  . acetaminophen (TYLENOL) 325 MG tablet Take 650 mg by mouth every 6 (six) hours as needed for pain.   Marland Kitchen aspirin EC 81 MG tablet Take 81 mg by mouth daily.  . Misc Natural Products (OSTEO BI-FLEX JOINT SHIELD PO) Take by mouth as directed.  . Multiple Vitamin (MULTIVITAMIN) tablet Take 1 tablet by mouth daily.  Marland Kitchen omeprazole (PRILOSEC) 40 MG capsule Take 1 capsule (40 mg total) by mouth daily.  . vitamin B-12 (CYANOCOBALAMIN) 500 MCG tablet Take 500 mcg by mouth daily.   No current facility-administered medications on file prior to visit.     Review of Systems Per HPI unless specifically indicated in ROS section     Objective:    BP 132/90   Pulse 76   Wt (!) 302 lb 12.8 oz (137.3 kg)   SpO2 97%   BMI 41.07 kg/m   Wt Readings from Last 3 Encounters:  08/26/16 (!) 302 lb 12.8 oz (137.3 kg)  07/15/16 (!) 301 lb (136.5  kg)  06/08/13 283 lb (128.4 kg)    Physical Exam  Constitutional: He appears well-developed and well-nourished. No distress.  Cardiovascular: Normal rate, regular rhythm, normal heart sounds and intact distal pulses.   No murmur heard. Pulmonary/Chest: Effort normal and breath sounds normal. No respiratory distress. He has no wheezes. He has no rales.  Genitourinary: Rectum normal and prostate normal. Rectal exam shows no external hemorrhoid, no internal hemorrhoid, no fissure, no mass, no tenderness and anal tone normal. Prostate is not enlarged (20gm) and not tender.  Musculoskeletal: He exhibits edema (tr).  Nursing note and vitals reviewed.  Results for orders placed or performed in visit on 07/16/16  CBC  Result Value Ref Range   WBC 8.9    HGB 14.5 g/dL   platelet count 211   Comprehensive metabolic panel  Result Value Ref Range   Glucose 98    Albumin 4.2    Alkaline Phosphatase 104 U/L   ALT 23    AST 23 U/L   Bilirubin, Total 0.5    Creat 1.14    Sodium 140    Potassium 5.1 mmol/L  Lipid panel  Result Value Ref Range   Cholesterol 167    HDL 28 (A) 35 - 70 mg/dL   LDL (calc) 104    Triglycerides 175   PSA  Result Value  Ref Range   PSA 0.9   TSH  Result Value Ref Range   TSH 2.330       Assessment & Plan:   Problem List Items Addressed This Visit    Essential hypertension - Primary    Improved with addition of amlodipine 5mg  daily. Continue. Recheck 6 mo f/u visit.  Reviewed healthy diet and lifestyle changes to keep bp under control. Baseline EKG today.       Relevant Medications   amLODipine (NORVASC) 5 MG tablet       Follow up plan: Return in about 6 months (around 02/24/2017), or as needed, for follow up visit.  Ria Bush, MD

## 2016-08-30 ENCOUNTER — Other Ambulatory Visit: Payer: Self-pay | Admitting: Family Medicine

## 2016-08-30 DIAGNOSIS — R9431 Abnormal electrocardiogram [ECG] [EKG]: Secondary | ICD-10-CM

## 2016-09-07 ENCOUNTER — Other Ambulatory Visit: Payer: Self-pay | Admitting: Family Medicine

## 2016-09-29 ENCOUNTER — Other Ambulatory Visit: Payer: Self-pay

## 2016-09-29 ENCOUNTER — Encounter (INDEPENDENT_AMBULATORY_CARE_PROVIDER_SITE_OTHER): Payer: Self-pay

## 2016-09-29 ENCOUNTER — Ambulatory Visit (HOSPITAL_COMMUNITY): Payer: 59 | Attending: Cardiovascular Disease

## 2016-09-29 DIAGNOSIS — R9431 Abnormal electrocardiogram [ECG] [EKG]: Secondary | ICD-10-CM | POA: Insufficient documentation

## 2016-09-29 DIAGNOSIS — Z6841 Body Mass Index (BMI) 40.0 and over, adult: Secondary | ICD-10-CM | POA: Diagnosis not present

## 2016-09-29 DIAGNOSIS — G4733 Obstructive sleep apnea (adult) (pediatric): Secondary | ICD-10-CM | POA: Diagnosis not present

## 2016-09-29 DIAGNOSIS — E669 Obesity, unspecified: Secondary | ICD-10-CM | POA: Insufficient documentation

## 2016-09-29 DIAGNOSIS — I119 Hypertensive heart disease without heart failure: Secondary | ICD-10-CM | POA: Diagnosis not present

## 2016-09-29 DIAGNOSIS — Z87891 Personal history of nicotine dependence: Secondary | ICD-10-CM | POA: Diagnosis not present

## 2016-10-02 ENCOUNTER — Encounter: Payer: Self-pay | Admitting: Family Medicine

## 2016-10-02 DIAGNOSIS — R9431 Abnormal electrocardiogram [ECG] [EKG]: Secondary | ICD-10-CM | POA: Insufficient documentation

## 2016-10-05 ENCOUNTER — Other Ambulatory Visit: Payer: Self-pay | Admitting: Family Medicine

## 2016-10-05 DIAGNOSIS — G4733 Obstructive sleep apnea (adult) (pediatric): Secondary | ICD-10-CM

## 2016-10-05 DIAGNOSIS — I10 Essential (primary) hypertension: Secondary | ICD-10-CM

## 2016-11-05 ENCOUNTER — Emergency Department
Admission: EM | Admit: 2016-11-05 | Discharge: 2016-11-05 | Disposition: A | Discharge status: Home / Self Care | Payer: 59 | Attending: Emergency Medicine | Admitting: Emergency Medicine

## 2016-11-05 ENCOUNTER — Emergency Department: Payer: 59

## 2016-11-05 DIAGNOSIS — S59902A Unspecified injury of left elbow, initial encounter: Secondary | ICD-10-CM | POA: Diagnosis not present

## 2016-11-05 DIAGNOSIS — M25522 Pain in left elbow: Secondary | ICD-10-CM | POA: Diagnosis not present

## 2016-11-05 DIAGNOSIS — Y999 Unspecified external cause status: Secondary | ICD-10-CM | POA: Insufficient documentation

## 2016-11-05 DIAGNOSIS — S52125A Nondisplaced fracture of head of left radius, initial encounter for closed fracture: Secondary | ICD-10-CM | POA: Insufficient documentation

## 2016-11-05 DIAGNOSIS — Z7982 Long term (current) use of aspirin: Secondary | ICD-10-CM | POA: Diagnosis not present

## 2016-11-05 DIAGNOSIS — Y939 Activity, unspecified: Secondary | ICD-10-CM | POA: Insufficient documentation

## 2016-11-05 DIAGNOSIS — Y929 Unspecified place or not applicable: Secondary | ICD-10-CM | POA: Diagnosis not present

## 2016-11-05 DIAGNOSIS — Z87891 Personal history of nicotine dependence: Secondary | ICD-10-CM | POA: Insufficient documentation

## 2016-11-05 DIAGNOSIS — I10 Essential (primary) hypertension: Secondary | ICD-10-CM | POA: Insufficient documentation

## 2016-11-05 DIAGNOSIS — W108XXA Fall (on) (from) other stairs and steps, initial encounter: Secondary | ICD-10-CM | POA: Diagnosis not present

## 2016-11-05 HISTORY — DX: Essential (primary) hypertension: I10

## 2016-11-05 MED ORDER — IBUPROFEN 800 MG PO TABS: 800.0000 mg | ORAL_TABLET | Freq: Three times a day (TID) | ORAL | 0 refills | Status: DC | PRN

## 2016-11-05 MED ORDER — HYDROCODONE-ACETAMINOPHEN 5-325 MG PO TABS: 1.0000 | ORAL_TABLET | Freq: Four times a day (QID) | ORAL | 0 refills | Status: DC | PRN

## 2016-11-05 MED ORDER — HYDROCODONE-ACETAMINOPHEN 5-325 MG PO TABS
1.0000 | ORAL_TABLET | ORAL | Status: AC
  Administered 2016-11-05: 1 via ORAL
  Filled 2016-11-05: qty 1

## 2016-11-05 NOTE — ED Triage Notes (Signed)
Pt reports that he injured left elbow today - pt slipped on steps and hit left elbow - he has had a previous injury to this elbow and feels like he has broken the elbow

## 2016-11-05 NOTE — Discharge Instructions (Signed)
Please take ibuprofen as needed for mild to moderate pain take Norco as needed for severe pain. Continue with splint and sling until follow-up with orthopedic provider.

## 2016-11-05 NOTE — ED Provider Notes (Signed)
[facility] Provider Note   CSN: [id number] Arrival date & time: 11/05/16  1640     History   Chief Complaint Chief Complaint  Patient presents with  . Elbow Pain    HPI Phillip Miles is a 56 y.o. male presents to the [facility] for evaluation of left elbow pain. Patient fell around 10 AM this morning, landed on his left upper extremity and developed left elbow pain. He has a history of left radial head fracture that occurred in 2012. Patient developed some limited range of motion of his fracture. Patient's pain is along the radial head of the left elbow. He denies any shoulder forearm or wrist pain. No numbness or tingling. He has not taken any medications for pain. Has mild pain with rest and 8 out of 10 pain with movement. HPI  Past Medical History:  Diagnosis Date  . Acute biliary pancreatitis 2014   s/p cholecystectomy  . Arthritis   . Blood pressure elevated without history of HTN   . Depression    h/o, job related, resolved as of 2013  . Elbow fracture    left  . GERD (gastroesophageal reflux disease)   . Hypertension   . Nocturia   . OSA (obstructive sleep apnea) 07/2014   sleep study in chart - AHI 33, RI 36, lowest sat 76%  . Seasonal allergies     Patient Active Problem List   Diagnosis Date Noted  . Nonspecific abnormal electrocardiogram (ECG) (EKG) 10/02/2016  . Essential hypertension 07/15/2016  . Obesity, morbid, BMI 40.0-49.9 ([place]) 07/15/2016  . OSA (obstructive sleep apnea) 07/09/2014  . GERD (gastroesophageal reflux disease) 12/03/2011  . Routine general medical examination at a health care facility 12/03/2011    Past Surgical History:  Procedure Laterality Date  . CHOLECYSTECTOMY N/A 05/13/2013   biliary pancreatitis; Gayland Curry, MD  . COLONOSCOPY  01/2012   HP, rpt 10 yrs Carlean Purl)  . REFRACTIVE SURGERY     Lasik [facility]       Home Medications    Prior to Admission medications   Medication Sig Start Date End Date  Taking? Authorizing Provider  acetaminophen (TYLENOL) 325 MG tablet Take 650 mg by mouth every 6 (six) hours as needed for pain.     Historical Provider, MD  amLODipine (NORVASC) 5 MG tablet Take 1 tablet (5 mg total) by mouth daily. 08/26/16   Ria Bush, MD  aspirin EC 81 MG tablet Take 81 mg by mouth daily.    Historical Provider, MD  Misc Natural Products (OSTEO BI-FLEX JOINT SHIELD PO) Take by mouth as directed.    Historical Provider, MD  Multiple Vitamin (MULTIVITAMIN) tablet Take 1 tablet by mouth daily.    Historical Provider, MD  omeprazole (PRILOSEC) 40 MG capsule TAKE 1 CAPSULE (40 MG TOTAL) BY MOUTH DAILY. 09/09/16   Ria Bush, MD  vitamin B-12 (CYANOCOBALAMIN) 500 MCG tablet Take 500 mcg by mouth daily.    Historical Provider, MD    Family History Family History  Problem Relation Age of Onset  . Diabetes Mother   . Alcohol abuse Father   . Cancer Father     prostate, lung (smoker)  . CAD Father     MI  . Heart disease Father     pacemaker  . Cirrhosis Maternal Grandfather   . Stroke Neg Hx     Social History Social History  Substance Use Topics  . Smoking status: Former Smoker    Packs/day: 2.00  Years: 23.00    Types: Cigarettes    Quit date: 09/08/1997  . Smokeless tobacco: Never Used  . Alcohol use Yes     Comment: infrequent     Allergies   Patient has no known allergies.   Review of Systems Review of Systems  Constitutional: Negative.  Negative for activity change, appetite change, chills and fever.  HENT: Negative for congestion, ear pain, mouth sores, rhinorrhea, sinus pressure, sore throat and trouble swallowing.   Eyes: Negative for photophobia, pain and discharge.  Respiratory: Negative for cough, chest tightness and shortness of breath.   Cardiovascular: Negative for chest pain and leg swelling.  Gastrointestinal: Negative for abdominal distention, abdominal pain, diarrhea, nausea and vomiting.  Genitourinary: Negative for  difficulty urinating and dysuria.  Musculoskeletal: Positive for arthralgias. Negative for back pain, gait problem and neck pain.  Skin: Negative for color change and rash.  Neurological: Negative for dizziness and headaches.  Hematological: Negative for adenopathy.  Psychiatric/Behavioral: Negative for agitation and behavioral problems.     Physical Exam Updated Vital Signs BP (!) 151/86   Pulse 77   Temp 98.4 F (36.9 C) (Oral)   Resp 17   Ht 6' (1.829 m)   Wt 136.1 kg   SpO2 98%   BMI 40.69 kg/m   Physical Exam  Constitutional: He appears well-developed and well-nourished.  HENT:  Head: Normocephalic and atraumatic.  Eyes: Conjunctivae are normal.  Neck: Neck supple.  Cardiovascular: Normal rate and regular rhythm.   No murmur heard. Pulmonary/Chest: Effort normal. No respiratory distress.  Abdominal: Soft.  Musculoskeletal: He exhibits no edema.  Examination of the left upper extremity shows patient has limited flexion and extension of the elbow secondary to pain. There is no swelling or skin breakdown noted. He is tender along the radial head. Mild arthritic changes noted.  Neurological: He is alert.  Skin: Skin is warm and dry.  Psychiatric: He has a normal mood and affect.  Nursing note and vitals reviewed.    ED Treatments / Results  Labs (all labs ordered are listed, but only abnormal results are displayed) Labs Reviewed - No data to display  EKG  EKG Interpretation None       Radiology Dg Elbow Complete Left  Result Date: 11/05/2016 CLINICAL DATA:  56 y/o  M; elbow injury with pain. EXAM: LEFT ELBOW - COMPLETE 3+ VIEW COMPARISON:  None. FINDINGS: Cortical irregularity of the anterior and lateral aspect of radial head. No joint effusion or joint dislocation. No other fracture identified. IMPRESSION: Cortical irregularity of the anterior and lateral aspect of radial head likely representing nondisplaced acute fracture. No other fracture or joint  dislocation identified. Electronically Signed   By: Kristine Garbe M.D.   On: 11/05/2016 17:51    Procedures Procedures (including critical care time) SPLINT APPLICATION Date/Time: [id number] PM Authorized by: Feliberto Gottron Consent: Verbal consent obtained. Risks and benefits: risks, benefits and alternatives were discussed Consent given by: patient Splint applied by: ED  PA Location details: Left elbow  Splint type: Posterior elbow splint  Supplies used: Ortho-Glass, Ace wrap, splint padding, sling  Post-procedure: The splinted body part was neurovascularly unchanged following the procedure. Patient tolerance: Patient tolerated the procedure well with no immediate complications.     Medications Ordered in ED Medications  HYDROcodone-acetaminophen (NORCO/VICODIN) 5-325 MG per tablet 1 tablet (1 tablet Oral Given 11/05/16 1751)     Initial Impression / Assessment and Plan / ED Course  I have reviewed the triage vital signs  and the nursing notes.  Pertinent labs & imaging results that were available during my care of the patient were reviewed by me and considered in my medical decision making (see chart for details).     56 year old male with nondisplaced left elbow radial head fracture. Patient is given a prescription for ibuprofen, Norco. He is placed into a posterior splint and sling. He will follow-up with orthopedics.  Final Clinical Impressions(s) / ED Diagnoses   Final diagnoses:  Closed nondisplaced fracture of head of left radius, initial encounter    New Prescriptions New Prescriptions   No medications on file     Duanne Guess, PA-C 11/05/16 1814    Lisa Roca, MD 11/05/16 [phone]

## 2016-11-05 NOTE — ED Notes (Signed)
See triage note  States he slipped and fell going up steps  Hit left elbow on rail  Having increased pain to elbow with decreased movement

## 2016-11-05 NOTE — ED Notes (Signed)
X-ray at bedside

## 2016-11-11 DIAGNOSIS — M25522 Pain in left elbow: Secondary | ICD-10-CM | POA: Diagnosis not present

## 2016-11-11 DIAGNOSIS — S52125A Nondisplaced fracture of head of left radius, initial encounter for closed fracture: Secondary | ICD-10-CM | POA: Diagnosis not present

## 2016-12-04 DIAGNOSIS — S52125A Nondisplaced fracture of head of left radius, initial encounter for closed fracture: Secondary | ICD-10-CM | POA: Diagnosis not present

## 2016-12-04 DIAGNOSIS — S52125D Nondisplaced fracture of head of left radius, subsequent encounter for closed fracture with routine healing: Secondary | ICD-10-CM | POA: Diagnosis not present

## 2017-02-23 ENCOUNTER — Encounter (INDEPENDENT_AMBULATORY_CARE_PROVIDER_SITE_OTHER): Payer: Self-pay

## 2017-02-23 ENCOUNTER — Encounter: Payer: Self-pay | Admitting: Family Medicine

## 2017-02-23 ENCOUNTER — Ambulatory Visit (INDEPENDENT_AMBULATORY_CARE_PROVIDER_SITE_OTHER): Payer: 59 | Admitting: Family Medicine

## 2017-02-23 VITALS — BP 136/84 | HR 65 | Temp 98.3°F | Ht 72.0 in | Wt 293.5 lb

## 2017-02-23 DIAGNOSIS — G4733 Obstructive sleep apnea (adult) (pediatric): Secondary | ICD-10-CM

## 2017-02-23 DIAGNOSIS — I10 Essential (primary) hypertension: Secondary | ICD-10-CM | POA: Diagnosis not present

## 2017-02-23 MED ORDER — RANITIDINE HCL 150 MG PO TABS: 150.0000 mg | ORAL_TABLET | Freq: Every evening | ORAL | Status: DC | PRN

## 2017-02-23 NOTE — Assessment & Plan Note (Signed)
Chronic, stable. Continue current regimen. 

## 2017-02-23 NOTE — Assessment & Plan Note (Signed)
Discussed healthy diet and lifestyle changes to affect sustainable weight loss  

## 2017-02-23 NOTE — Progress Notes (Signed)
BP 136/84   Pulse 65   Temp 98.3 F (36.8 C)   Ht 6' (1.829 m)   Wt 293 lb 8 oz (133.1 kg)   SpO2 97%   BMI 39.81 kg/m    CC: BP check Subjective:    Patient ID: KENDEL BESSEY, male    DOB: Aug 04, 1961, 56 y.o.   MRN: [id number]  HPI: EMMAUS BRANDI is a 56 y.o. male presenting on 02/23/2017 for Blood Pressure Check   HTN - Compliant with current antihypertensive regimen of amlodipine 5mg  daily. Does not check blood pressures at home. No low blood pressure symptoms of dizziness/syncope. Denies HA, vision changes, CP/tightness, SOB, leg swelling.    Stationary bicycle twice weekly.  Pt looking into new diet plan.   OSA - ?of this. He declined further evaluation.   EKG returned abnormal so echocardiogram was ordered - normal EF, valves, mild-mod LA dilatation and RV dilatation.   Relevant past medical, surgical, family and social history reviewed and updated as indicated. Interim medical history since our last visit reviewed. Allergies and medications reviewed and updated. Outpatient Medications Prior to Visit  Medication Sig Dispense Refill  . acetaminophen (TYLENOL) 325 MG tablet Take 650 mg by mouth every 6 (six) hours as needed for pain.     Marland Kitchen amLODipine (NORVASC) 5 MG tablet Take 1 tablet (5 mg total) by mouth daily. 90 tablet 3  . aspirin EC 81 MG tablet Take 81 mg by mouth daily.    Marland Kitchen ibuprofen (ADVIL,MOTRIN) 800 MG tablet Take 1 tablet (800 mg total) by mouth every 8 (eight) hours as needed. 30 tablet 0  . Multiple Vitamin (MULTIVITAMIN) tablet Take 1 tablet by mouth daily.    Marland Kitchen omeprazole (PRILOSEC) 40 MG capsule TAKE 1 CAPSULE (40 MG TOTAL) BY MOUTH DAILY. 30 capsule 5  . vitamin B-12 (CYANOCOBALAMIN) 500 MCG tablet Take 500 mcg by mouth daily.    Marland Kitchen HYDROcodone-acetaminophen (NORCO) 5-325 MG tablet Take 1 tablet by mouth every 6 (six) hours as needed for moderate pain. 15 tablet 0  . Misc Natural Products (OSTEO BI-FLEX JOINT SHIELD PO) Take by mouth as directed.       No facility-administered medications prior to visit.      Per HPI unless specifically indicated in ROS section below Review of Systems     Objective:    BP 136/84   Pulse 65   Temp 98.3 F (36.8 C)   Ht 6' (1.829 m)   Wt 293 lb 8 oz (133.1 kg)   SpO2 97%   BMI 39.81 kg/m   Wt Readings from Last 3 Encounters:  02/23/17 293 lb 8 oz (133.1 kg)  11/05/16 300 lb (136.1 kg)  08/26/16 (!) 302 lb 12.8 oz (137.3 kg)    Physical Exam  Constitutional: He appears well-developed and well-nourished. No distress.  HENT:  Mouth/Throat: Oropharynx is clear and moist. No oropharyngeal exudate.  Eyes: Conjunctivae are normal. Pupils are equal, round, and reactive to light.  Cardiovascular: Normal rate, regular rhythm, normal heart sounds and intact distal pulses.   No murmur heard. Pulmonary/Chest: Effort normal and breath sounds normal. No respiratory distress. He has no wheezes. He has no rales.  Musculoskeletal: He exhibits no edema.  Skin: Skin is warm and dry. No rash noted.  Psychiatric: He has a normal mood and affect.  Nursing note and vitals reviewed.      Assessment & Plan:   Problem List Items Addressed This Visit    Essential hypertension -  Primary    Chronic, stable. Continue current regimen.       Obesity, morbid, BMI 40.0-49.9 ([facility])    Discussed healthy diet and lifestyle changes to affect sustainable weight loss      OSA (obstructive sleep apnea)    Pt declines further evaluation for this at the present. Discussed long term risks of untreated sleep apnea. Discussed right ventricular enlargement noted. He prefers to continue working towards weight loss.           Follow up plan: Return in about 6 months (around 08/25/2017) for annual exam, prior fasting for blood work.  Ria Bush, MD

## 2017-02-23 NOTE — Patient Instructions (Signed)
You are doing well today. Continue amlodipine.  Return in 6 months for physical.  Continue exercise regimen.

## 2017-02-23 NOTE — Assessment & Plan Note (Signed)
Pt declines further evaluation for this at the present. Discussed long term risks of untreated sleep apnea. Discussed right ventricular enlargement noted. He prefers to continue working towards weight loss.

## 2017-07-07 ENCOUNTER — Other Ambulatory Visit: Payer: Self-pay | Admitting: Family Medicine

## 2017-07-07 DIAGNOSIS — I10 Essential (primary) hypertension: Secondary | ICD-10-CM

## 2017-07-07 DIAGNOSIS — E785 Hyperlipidemia, unspecified: Secondary | ICD-10-CM | POA: Insufficient documentation

## 2017-07-07 DIAGNOSIS — Z125 Encounter for screening for malignant neoplasm of prostate: Secondary | ICD-10-CM

## 2017-07-10 ENCOUNTER — Other Ambulatory Visit: Payer: 59

## 2017-07-17 ENCOUNTER — Encounter: Payer: 59 | Admitting: Family Medicine

## 2017-07-17 ENCOUNTER — Other Ambulatory Visit (INDEPENDENT_AMBULATORY_CARE_PROVIDER_SITE_OTHER): Payer: 59

## 2017-07-17 DIAGNOSIS — E785 Hyperlipidemia, unspecified: Secondary | ICD-10-CM | POA: Diagnosis not present

## 2017-07-17 DIAGNOSIS — Z125 Encounter for screening for malignant neoplasm of prostate: Secondary | ICD-10-CM | POA: Diagnosis not present

## 2017-07-17 DIAGNOSIS — I10 Essential (primary) hypertension: Secondary | ICD-10-CM | POA: Diagnosis not present

## 2017-07-17 LAB — BASIC METABOLIC PANEL
BUN: 25 mg/dL — AB (ref 6–23)
CHLORIDE: 105 meq/L (ref 96–112)
CO2: 30 mEq/L (ref 19–32)
Calcium: 9.6 mg/dL (ref 8.4–10.5)
Creatinine, Ser: 1.25 mg/dL (ref 0.40–1.50)
GFR: 63.49 mL/min (ref >60.00)
GLUCOSE: 98 mg/dL (ref 70–99)
POTASSIUM: 4.8 meq/L (ref 3.5–5.1)
SODIUM: 141 meq/L (ref 135–145)

## 2017-07-17 LAB — LIPID PANEL
Cholesterol: 197 mg/dL (ref 0–200)
HDL: 29.7 mg/dL — AB (ref >39.00)
NONHDL: 166.82
TRIGLYCERIDES: 212 mg/dL — AB (ref 0.0–149.0)
Total CHOL/HDL Ratio: 7
VLDL: 42.4 mg/dL — AB (ref 0.0–40.0)

## 2017-07-17 LAB — PSA: PSA: 0.9 ng/mL (ref 0.10–4.00)

## 2017-07-17 LAB — LDL CHOLESTEROL, DIRECT: Direct LDL: 126 mg/dL

## 2017-07-24 ENCOUNTER — Ambulatory Visit (INDEPENDENT_AMBULATORY_CARE_PROVIDER_SITE_OTHER): Payer: 59 | Admitting: Family Medicine

## 2017-07-24 ENCOUNTER — Encounter: Payer: Self-pay | Admitting: Family Medicine

## 2017-07-24 VITALS — BP 120/82 | HR 64 | Temp 98.2°F | Ht 72.0 in | Wt 267.0 lb

## 2017-07-24 DIAGNOSIS — Z23 Encounter for immunization: Secondary | ICD-10-CM

## 2017-07-24 DIAGNOSIS — E669 Obesity, unspecified: Secondary | ICD-10-CM

## 2017-07-24 DIAGNOSIS — I10 Essential (primary) hypertension: Secondary | ICD-10-CM

## 2017-07-24 DIAGNOSIS — Z0000 Encounter for general adult medical examination without abnormal findings: Secondary | ICD-10-CM | POA: Diagnosis not present

## 2017-07-24 DIAGNOSIS — E785 Hyperlipidemia, unspecified: Secondary | ICD-10-CM | POA: Diagnosis not present

## 2017-07-24 DIAGNOSIS — G4733 Obstructive sleep apnea (adult) (pediatric): Secondary | ICD-10-CM | POA: Diagnosis not present

## 2017-07-24 MED ORDER — AMLODIPINE BESYLATE 5 MG PO TABS: 5.0000 mg | ORAL_TABLET | Freq: Every day | ORAL | 3 refills | Status: DC

## 2017-07-24 NOTE — Patient Instructions (Addendum)
Flu shot today We will continue to monitor cholesterol levels. Congratulations with weight loss and better sugar control! Return as needed or in 1 year for next physical.  Health Maintenance, Male A healthy lifestyle and preventive care is important for your health and wellness. Ask your health care provider about what schedule of regular examinations is right for you. What should I know about weight and diet? Eat a Healthy Diet  Eat plenty of vegetables, fruits, whole grains, low-fat dairy products, and lean protein.  Do not eat a lot of foods high in solid fats, added sugars, or salt.  Maintain a Healthy Weight Regular exercise can help you achieve or maintain a healthy weight. You should:  Do at least 150 minutes of exercise each week. The exercise should increase your heart rate and make you sweat (moderate-intensity exercise).  Do strength-training exercises at least twice a week.  Watch Your Levels of Cholesterol and Blood Lipids  Have your blood tested for lipids and cholesterol every 5 years starting at 56 years of age. If you are at high risk for heart disease, you should start having your blood tested when you are 56 years old. You may need to have your cholesterol levels checked more often if: ? Your lipid or cholesterol levels are high. ? You are older than 56 years of age. ? You are at high risk for heart disease.  What should I know about cancer screening? Many types of cancers can be detected early and may often be prevented. Lung Cancer  You should be screened every year for lung cancer if: ? You are a current smoker who has smoked for at least 30 years. ? You are a former smoker who has quit within the past 15 years.  Talk to your health care provider about your screening options, when you should start screening, and how often you should be screened.  Colorectal Cancer  Routine colorectal cancer screening usually begins at 56 years of age and should be  repeated every 5-10 years until you are 56 years old. You may need to be screened more often if early forms of precancerous polyps or small growths are found. Your health care provider may recommend screening at an earlier age if you have risk factors for colon cancer.  Your health care provider may recommend using home test kits to check for hidden blood in the stool.  A small camera at the end of a tube can be used to examine your colon (sigmoidoscopy or colonoscopy). This checks for the earliest forms of colorectal cancer.  Prostate and Testicular Cancer  Depending on your age and overall health, your health care provider may do certain tests to screen for prostate and testicular cancer.  Talk to your health care provider about any symptoms or concerns you have about testicular or prostate cancer.  Skin Cancer  Check your skin from head to toe regularly.  Tell your health care provider about any new moles or changes in moles, especially if: ? There is a change in a mole's size, shape, or color. ? You have a mole that is larger than a pencil eraser.  Always use sunscreen. Apply sunscreen liberally and repeat throughout the day.  Protect yourself by wearing long sleeves, pants, a wide-brimmed hat, and sunglasses when outside.  What should I know about heart disease, diabetes, and high blood pressure?  If you are 8-1 years of age, have your blood pressure checked every 3-5 years. If you are 40 years of  age or older, have your blood pressure checked every year. You should have your blood pressure measured twice-once when you are at a hospital or clinic, and once when you are not at a hospital or clinic. Record the average of the two measurements. To check your blood pressure when you are not at a hospital or clinic, you can use: ? An automated blood pressure machine at a pharmacy. ? A home blood pressure monitor.  Talk to your health care provider about your target blood  pressure.  If you are between 68-5 years old, ask your health care provider if you should take aspirin to prevent heart disease.  Have regular diabetes screenings by checking your fasting blood sugar level. ? If you are at a normal weight and have a low risk for diabetes, have this test once every three years after the age of 74. ? If you are overweight and have a high risk for diabetes, consider being tested at a younger age or more often.  A one-time screening for abdominal aortic aneurysm (AAA) by ultrasound is recommended for men aged 62-75 years who are current or former smokers. What should I know about preventing infection? Hepatitis B If you have a higher risk for hepatitis B, you should be screened for this virus. Talk with your health care provider to find out if you are at risk for hepatitis B infection. Hepatitis C Blood testing is recommended for:  Everyone born from [birth year] through 1965.  Anyone with known risk factors for hepatitis C.  Sexually Transmitted Diseases (STDs)  You should be screened each year for STDs including gonorrhea and chlamydia if: ? You are sexually active and are younger than 56 years of age. ? You are older than 56 years of age and your health care provider tells you that you are at risk for this type of infection. ? Your sexual activity has changed since you were last screened and you are at an increased risk for chlamydia or gonorrhea. Ask your health care provider if you are at risk.  Talk with your health care provider about whether you are at high risk of being infected with HIV. Your health care provider may recommend a prescription medicine to help prevent HIV infection.  What else can I do?  Schedule regular health, dental, and eye exams.  Stay current with your vaccines (immunizations).  Do not use any tobacco products, such as cigarettes, chewing tobacco, and e-cigarettes. If you need help quitting, ask your health care  provider.  Limit alcohol intake to no more than 2 drinks per day. One drink equals 12 ounces of beer, 5 ounces of wine, or 1 ounces of hard liquor.  Do not use street drugs.  Do not share needles.  Ask your health care provider for help if you need support or information about quitting drugs.  Tell your health care provider if you often feel depressed.  Tell your health care provider if you have ever been abused or do not feel safe at home. This information is not intended to replace advice given to you by your health care provider. Make sure you discuss any questions you have with your health care provider. Document Released: 02/21/2008 Document Revised: 04/23/2016 Document Reviewed: 05/29/2015 Elsevier Interactive Patient Education  [employer].

## 2017-07-24 NOTE — Assessment & Plan Note (Signed)
Chronic off meds. Pt declines statin at this time.  The 10-year ASCVD risk score Mikey Bussing DC Brooke Bonito., et al., 2013) is: 9.7%   Values used to calculate the score:     Age: 56 years     Sex: Male     Is Non-Hispanic African American: No     Diabetic: No     Tobacco smoker: No     Systolic Blood Pressure: 703 mmHg     Is BP treated: Yes     HDL Cholesterol: 29.7 mg/dL     Total Cholesterol: 197 mg/dL

## 2017-07-24 NOTE — Progress Notes (Signed)
BP 120/82 (BP Location: Left Arm, Patient Position: Sitting, Cuff Size: Large)   Pulse 64   Temp 98.2 F (36.8 C) (Oral)   Ht 6' (1.829 m)   Wt 267 lb (121.1 kg)   SpO2 96%   BMI 36.21 kg/m    CC: CPE Subjective:    [name], male    DOB: Nov 04, 1960, 56 y.o.   MRN: [id number]  HPI: Phillip Miles is a 56 y.o. male presenting on 07/24/2017 for Annual Exam   Another 25 lb weight loss. Following keto diet but not strict for this. Starting weight 302 lbs.   Preventative: COLONOSCOPY 01/2012; HP, rpt 10 yrs Carlean Purl) Prostate screening - will do yearly given family history  Flu shot yearly Td 2006 Seat belt use discussed Sunscreen use discussed. 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 Interim medical history since our last visit reviewed. Allergies and medications reviewed and updated. Outpatient Medications Prior to Visit  Medication Sig Dispense Refill  . ibuprofen (ADVIL,MOTRIN) 200 MG tablet Take 200 mg every 6 (six) hours as needed by mouth.    Marland Kitchen MEGARED OMEGA-3 KRILL OIL PO Take 1 capsule by mouth.    . Misc Natural Products (GLUCOSAMINE-CHONDROITIN SULF PO) Take 1 capsule by mouth.    Marland Kitchen acetaminophen (TYLENOL) 325 MG tablet Take 650 mg by mouth every 6 (six) hours as needed for pain.     Marland Kitchen aspirin EC 81 MG tablet Take 81 mg by mouth daily.    . Multiple Vitamin (MULTIVITAMIN) tablet Take 1 tablet by mouth daily.    . ranitidine (ZANTAC) 150 MG tablet Take 1 tablet (150 mg total) by mouth at bedtime as needed for heartburn.    Marland Kitchen amLODipine (NORVASC) 5 MG tablet Take 1 tablet (5 mg total) by mouth daily. 90 tablet 3  .  ibuprofen (ADVIL,MOTRIN) 800 MG tablet Take 1 tablet (800 mg total) by mouth every 8 (eight) hours as needed. 30 tablet 0   No facility-administered medications prior to visit.      Per HPI unless specifically indicated in ROS section below Review of Systems  Constitutional: Negative for activity change, appetite change, chills, fatigue, fever and unexpected weight change.  HENT: Negative for hearing loss.   Eyes: Negative for visual disturbance.  Respiratory: Negative for cough, chest tightness, shortness of breath and wheezing.   Cardiovascular: Negative for chest pain, palpitations and leg swelling.  Gastrointestinal: Positive for constipation (occasional). Negative for abdominal distention, abdominal pain, blood in stool, diarrhea, nausea and vomiting.  Genitourinary: Negative for difficulty urinating and hematuria.  Musculoskeletal: Negative for arthralgias, myalgias and neck pain.  Skin: Negative for rash.  Neurological: Negative for dizziness, seizures, syncope and headaches.  Hematological: Negative for adenopathy. Does not bruise/bleed easily.  Psychiatric/Behavioral: Negative for dysphoric mood. The patient is not nervous/anxious.        Objective:    BP 120/82 (BP Location: Left Arm, Patient Position: Sitting, Cuff Size: Large)   Pulse 64   Temp 98.2 F (36.8 C) (Oral)   Ht 6' (1.829 m)   Wt 267 lb (121.1 kg)   SpO2 96%   BMI 36.21 kg/m   Wt Readings from Last 3  Encounters:  07/24/17 267 lb (121.1 kg)  02/23/17 293 lb 8 oz (133.1 kg)  11/05/16 300 lb (136.1 kg)    Physical Exam  Constitutional: He is oriented to person, place, and time. He appears well-developed and well-nourished. No distress.  HENT:  Head: Normocephalic and atraumatic.  Right Ear: Hearing, tympanic membrane, external ear and ear canal normal.  Left Ear: Hearing, tympanic membrane, external ear and ear canal normal.  Nose: Nose normal.  Mouth/Throat: Uvula is midline, oropharynx is clear and  moist and mucous membranes are normal. No oropharyngeal exudate, posterior oropharyngeal edema or posterior oropharyngeal erythema.  Eyes: Conjunctivae and EOM are normal. Pupils are equal, round, and reactive to light. No scleral icterus.  Neck: Normal range of motion. Neck supple. No thyromegaly present.  Cardiovascular: Normal rate, regular rhythm, normal heart sounds and intact distal pulses.  No murmur heard. Pulses:      Radial pulses are 2+ on the right side, and 2+ on the left side.  Pulmonary/Chest: Effort normal and breath sounds normal. No respiratory distress. He has no wheezes. He has no rales.  Abdominal: Soft. Bowel sounds are normal. He exhibits no distension and no mass. There is no tenderness. There is no rebound and no guarding.  Genitourinary: Rectum normal and prostate normal. Rectal exam shows no external hemorrhoid, no fissure, no mass, no tenderness and anal tone normal. Prostate is not enlarged (20gm) and not tender.  Musculoskeletal: Normal range of motion. He exhibits no edema.  Lymphadenopathy:    He has no cervical adenopathy.  Neurological: He is alert and oriented to person, place, and time.  CN grossly intact, station and gait intact  Skin: Skin is warm and dry. No rash noted.  Psychiatric: He has a normal mood and affect. His behavior is normal. Judgment and thought content normal.  Nursing note and vitals reviewed.  Results for orders placed or performed in visit on 11/94/17  Basic metabolic panel  Result Value Ref Range   Sodium 141 135 - 145 mEq/L   Potassium 4.8 3.5 - 5.1 mEq/L   Chloride 105 96 - 112 mEq/L   CO2 30 19 - 32 mEq/L   Glucose, Bld 98 70 - 99 mg/dL   BUN 25 (H) 6 - 23 mg/dL   Creatinine, Ser 1.25 0.40 - 1.50 mg/dL   Calcium 9.6 8.4 - 10.5 mg/dL   GFR 63.49 >60.00 mL/min  PSA  Result Value Ref Range   PSA 0.90 0.10 - 4.00 ng/mL  Lipid panel  Result Value Ref Range   Cholesterol 197 0 - 200 mg/dL   Triglycerides 212.0 (H) 0.0 -  149.0 mg/dL   HDL 29.70 (L) >39.00 mg/dL   VLDL 42.4 (H) 0.0 - 40.0 mg/dL   Total CHOL/HDL Ratio 7    NonHDL 166.82   LDL cholesterol, direct  Result Value Ref Range   Direct LDL 126.0 mg/dL      Assessment & Plan:   Problem List Items Addressed This Visit    Dyslipidemia    Chronic off meds. Pt declines statin at this time.  The 10-year ASCVD risk score Mikey Bussing DC Brooke Bonito., et al., 2013) is: 9.7%   Values used to calculate the score:     Age: 33 years     Sex: Male     Is Non-Hispanic African American: No     Diabetic: No     Tobacco smoker: No     Systolic Blood Pressure: 408 mmHg     Is BP  treated: Yes     HDL Cholesterol: 29.7 mg/dL     Total Cholesterol: 197 mg/dL       Essential hypertension    Chronic, stable. Continue current regimen.       Relevant Medications   amLODipine (NORVASC) 5 MG tablet   Health maintenance examination - Primary    Preventative protocols reviewed and updated unless pt declined. Discussed healthy diet and lifestyle.       Obesity, Class II, BMI 35-39.9, no comorbidity    Discussed healthy diet and lifestyle changes to affect sustainable weight loss.       OSA (obstructive sleep apnea)    Subjective improvement with weight loss.        Other Visit Diagnoses    Need for influenza vaccination       Relevant Orders   Flu Vaccine QUAD 6+ mos PF IM (Fluarix Quad PF) (Completed)       Follow up plan: Return in about 1 year (around 07/24/2018) for annual exam, prior fasting for blood work.  Ria Bush, MD

## 2017-07-24 NOTE — Assessment & Plan Note (Signed)
Preventative protocols reviewed and updated unless pt declined. Discussed healthy diet and lifestyle.  

## 2017-07-24 NOTE — Assessment & Plan Note (Signed)
Discussed healthy diet and lifestyle changes to affect sustainable weight loss  

## 2017-07-24 NOTE — Assessment & Plan Note (Signed)
Chronic, stable. Continue current regimen. 

## 2017-07-24 NOTE — Assessment & Plan Note (Signed)
Subjective improvement with weight loss.

## 2017-08-25 ENCOUNTER — Encounter: Payer: 59 | Admitting: Family Medicine

## 2018-07-20 ENCOUNTER — Other Ambulatory Visit: Payer: Self-pay | Admitting: Family Medicine

## 2018-07-20 ENCOUNTER — Other Ambulatory Visit (INDEPENDENT_AMBULATORY_CARE_PROVIDER_SITE_OTHER): Payer: 59

## 2018-07-20 DIAGNOSIS — Z125 Encounter for screening for malignant neoplasm of prostate: Secondary | ICD-10-CM | POA: Diagnosis not present

## 2018-07-20 DIAGNOSIS — E785 Hyperlipidemia, unspecified: Secondary | ICD-10-CM | POA: Diagnosis not present

## 2018-07-20 DIAGNOSIS — I10 Essential (primary) hypertension: Secondary | ICD-10-CM | POA: Diagnosis not present

## 2018-07-20 LAB — BASIC METABOLIC PANEL
BUN: 20 mg/dL (ref 6–23)
CALCIUM: 8.7 mg/dL (ref 8.4–10.5)
CO2: 27 meq/L (ref 19–32)
Chloride: 106 mEq/L (ref 96–112)
Creatinine, Ser: 1.08 mg/dL (ref 0.40–1.50)
GFR: 74.89 mL/min (ref >60.00)
GLUCOSE: 108 mg/dL — AB (ref 70–99)
Potassium: 4.1 mEq/L (ref 3.5–5.1)
SODIUM: 141 meq/L (ref 135–145)

## 2018-07-20 LAB — LIPID PANEL
CHOL/HDL RATIO: 5
CHOLESTEROL: 171 mg/dL (ref 0–200)
HDL: 35.8 mg/dL — ABNORMAL LOW (ref >39.00)
LDL CALC: 111 mg/dL — AB (ref 0–99)
NonHDL: 135.46
TRIGLYCERIDES: 121 mg/dL (ref 0.0–149.0)
VLDL: 24.2 mg/dL (ref 0.0–40.0)

## 2018-07-20 LAB — MICROALBUMIN / CREATININE URINE RATIO
Creatinine,U: 125.4 mg/dL
MICROALB/CREAT RATIO: 0.6 mg/g (ref 0.0–30.0)
Microalb, Ur: <0.7 mg/dL (ref 0.0–1.9)

## 2018-07-20 LAB — PSA: PSA: 1.04 ng/mL (ref 0.10–4.00)

## 2018-07-27 ENCOUNTER — Encounter: Payer: 59 | Admitting: Family Medicine

## 2018-07-30 ENCOUNTER — Ambulatory Visit (INDEPENDENT_AMBULATORY_CARE_PROVIDER_SITE_OTHER): Payer: 59 | Admitting: Family Medicine

## 2018-07-30 ENCOUNTER — Other Ambulatory Visit: Payer: Self-pay | Admitting: Family Medicine

## 2018-07-30 ENCOUNTER — Encounter: Payer: Self-pay | Admitting: Family Medicine

## 2018-07-30 VITALS — BP 134/78 | HR 69 | Temp 97.8°F | Ht 72.0 in | Wt 281.5 lb

## 2018-07-30 DIAGNOSIS — R600 Localized edema: Secondary | ICD-10-CM

## 2018-07-30 DIAGNOSIS — Z0000 Encounter for general adult medical examination without abnormal findings: Secondary | ICD-10-CM | POA: Diagnosis not present

## 2018-07-30 DIAGNOSIS — I10 Essential (primary) hypertension: Secondary | ICD-10-CM | POA: Diagnosis not present

## 2018-07-30 DIAGNOSIS — E669 Obesity, unspecified: Secondary | ICD-10-CM | POA: Diagnosis not present

## 2018-07-30 DIAGNOSIS — Z23 Encounter for immunization: Secondary | ICD-10-CM | POA: Diagnosis not present

## 2018-07-30 DIAGNOSIS — E66812 Obesity, class 2: Secondary | ICD-10-CM

## 2018-07-30 DIAGNOSIS — M7989 Other specified soft tissue disorders: Secondary | ICD-10-CM | POA: Insufficient documentation

## 2018-07-30 DIAGNOSIS — E785 Hyperlipidemia, unspecified: Secondary | ICD-10-CM

## 2018-07-30 DIAGNOSIS — R220 Localized swelling, mass and lump, head: Secondary | ICD-10-CM

## 2018-07-30 MED ORDER — FAMOTIDINE 20 MG PO TABS: 20.0000 mg | ORAL_TABLET | Freq: Every evening | ORAL | Status: DC | PRN

## 2018-07-30 MED ORDER — AMLODIPINE BESYLATE 5 MG PO TABS: 5.0000 mg | ORAL_TABLET | Freq: Every day | ORAL | 3 refills | Status: DC

## 2018-07-30 NOTE — Patient Instructions (Addendum)
Flu shot and Tdap today. You are doing well today. Cholesterol levels were improved.  Return as needed or in 1 year for next physical.  Try compression stockings for legs.  We will refer you to skin doctor for evaluation of forehead cyst  Health Maintenance, Male A healthy lifestyle and preventive care is important for your health and wellness. Ask your health care provider about what schedule of regular examinations is right for you. What should I know about weight and diet? Eat a Healthy Diet  Eat plenty of vegetables, fruits, whole grains, low-fat dairy products, and lean protein.  Do not eat a lot of foods high in solid fats, added sugars, or salt.  Maintain a Healthy Weight Regular exercise can help you achieve or maintain a healthy weight. You should:  Do at least 150 minutes of exercise each week. The exercise should increase your heart rate and make you sweat (moderate-intensity exercise).  Do strength-training exercises at least twice a week.  Watch Your Levels of Cholesterol and Blood Lipids  Have your blood tested for lipids and cholesterol every 5 years starting at 57 years of age. If you are at high risk for heart disease, you should start having your blood tested when you are 57 years old. You may need to have your cholesterol levels checked more often if: ? Your lipid or cholesterol levels are high. ? You are older than 57 years of age. ? You are at high risk for heart disease.  What should I know about cancer screening? Many types of cancers can be detected early and may often be prevented. Lung Cancer  You should be screened every year for lung cancer if: ? You are a current smoker who has smoked for at least 30 years. ? You are a former smoker who has quit within the past 15 years.  Talk to your health care provider about your screening options, when you should start screening, and how often you should be screened.  Colorectal Cancer  Routine colorectal  cancer screening usually begins at 57 years of age and should be repeated every 5-10 years until you are 57 years old. You may need to be screened more often if early forms of precancerous polyps or small growths are found. Your health care provider may recommend screening at an earlier age if you have risk factors for colon cancer.  Your health care provider may recommend using home test kits to check for hidden blood in the stool.  A small camera at the end of a tube can be used to examine your colon (sigmoidoscopy or colonoscopy). This checks for the earliest forms of colorectal cancer.  Prostate and Testicular Cancer  Depending on your age and overall health, your health care provider may do certain tests to screen for prostate and testicular cancer.  Talk to your health care provider about any symptoms or concerns you have about testicular or prostate cancer.  Skin Cancer  Check your skin from head to toe regularly.  Tell your health care provider about any new moles or changes in moles, especially if: ? There is a change in a mole's size, shape, or color. ? You have a mole that is larger than a pencil eraser.  Always use sunscreen. Apply sunscreen liberally and repeat throughout the day.  Protect yourself by wearing long sleeves, pants, a wide-brimmed hat, and sunglasses when outside.  What should I know about heart disease, diabetes, and high blood pressure?  If you are 18-39 years  of age, have your blood pressure checked every 3-5 years. If you are 41 years of age or older, have your blood pressure checked every year. You should have your blood pressure measured twice-once when you are at a hospital or clinic, and once when you are not at a hospital or clinic. Record the average of the two measurements. To check your blood pressure when you are not at a hospital or clinic, you can use: ? An automated blood pressure machine at a pharmacy. ? A home blood pressure monitor.  Talk to  your health care provider about your target blood pressure.  If you are between 66-65 years old, ask your health care provider if you should take aspirin to prevent heart disease.  Have regular diabetes screenings by checking your fasting blood sugar level. ? If you are at a normal weight and have a low risk for diabetes, have this test once every three years after the age of 20. ? If you are overweight and have a high risk for diabetes, consider being tested at a younger age or more often.  A one-time screening for abdominal aortic aneurysm (AAA) by ultrasound is recommended for men aged 43-75 years who are current or former smokers. What should I know about preventing infection? Hepatitis B If you have a higher risk for hepatitis B, you should be screened for this virus. Talk with your health care provider to find out if you are at risk for hepatitis B infection. Hepatitis C Blood testing is recommended for:  Everyone born from [birth year] through 1965.  Anyone with known risk factors for hepatitis C.  Sexually Transmitted Diseases (STDs)  You should be screened each year for STDs including gonorrhea and chlamydia if: ? You are sexually active and are younger than 57 years of age. ? You are older than 57 years of age and your health care provider tells you that you are at risk for this type of infection. ? Your sexual activity has changed since you were last screened and you are at an increased risk for chlamydia or gonorrhea. Ask your health care provider if you are at risk.  Talk with your health care provider about whether you are at high risk of being infected with HIV. Your health care provider may recommend a prescription medicine to help prevent HIV infection.  What else can I do?  Schedule regular health, dental, and eye exams.  Stay current with your vaccines (immunizations).  Do not use any tobacco products, such as cigarettes, chewing tobacco, and e-cigarettes. If you need  help quitting, ask your health care provider.  Limit alcohol intake to no more than 2 drinks per day. One drink equals 12 ounces of beer, 5 ounces of wine, or 1 ounces of hard liquor.  Do not use street drugs.  Do not share needles.  Ask your health care provider for help if you need support or information about quitting drugs.  Tell your health care provider if you often feel depressed.  Tell your health care provider if you have ever been abused or do not feel safe at home. This information is not intended to replace advice given to you by your health care provider. Make sure you discuss any questions you have with your health care provider. Document Released: 02/21/2008 Document Revised: 04/23/2016 Document Reviewed: 05/29/2015 Elsevier Interactive Patient Education  [employer].

## 2018-07-30 NOTE — Assessment & Plan Note (Signed)
Preventative protocols reviewed and updated unless pt declined. Discussed healthy diet and lifestyle.  

## 2018-07-30 NOTE — Assessment & Plan Note (Signed)
Longstanding, unchanging. Anticipate subcutaneous cyst. Desires evaluation for definitive management - will refer to derm

## 2018-07-30 NOTE — Addendum Note (Signed)
Addended by: Brenton Grills on: 00/16/4290 03:22 PM   Modules accepted: Orders

## 2018-07-30 NOTE — Assessment & Plan Note (Signed)
Anticipate CVI related - and pruritis may be another manifestation. Good pulses - anticipate no arterial insufficiency. Suggested compressoin stockings, Rx provided today.

## 2018-07-30 NOTE — Progress Notes (Signed)
BP 134/78 (BP Location: Right Arm, Patient Position: Sitting, Cuff Size: Large)   Pulse 69   Temp 97.8 F (36.6 C) (Oral)   Ht 6' (1.829 m)   Wt 281 lb 8 oz (127.7 kg)   SpO2 98%   BMI 38.18 kg/m    CC: CPE Subjective:    [name], male    DOB: [birth year], 57 y.o.   MRN: [id number]  HPI: [name] is a 57 y.o. male presenting on 07/30/2018 for Annual Exam   He is colorblind   Preventative: COLONOSCOPY 01/2012;HP, rpt 10 yrs Carlean Purl) Prostate screening - will do yearly given family history  Flu shot yearly Td 2006 Seat belt use discussed Sunscreen use discussed. 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 Interim medical history since our last visit reviewed. Allergies and medications reviewed and updated. Outpatient Medications Prior to Visit  Medication Sig Dispense Refill  . acetaminophen (TYLENOL) 325 MG tablet Take 650 mg by mouth every 6 (six) hours as needed for pain.     Marland Kitchen aspirin EC 81 MG tablet Take 81 mg by mouth daily.    Marland Kitchen ibuprofen (ADVIL,MOTRIN) 200 MG tablet Take 200 mg every 6 (six) hours as needed by mouth.    . Misc Natural Products (GLUCOSAMINE-CHONDROITIN SULF PO) Take 1 capsule by mouth.    . Multiple Vitamin (MULTIVITAMIN) tablet Take 1 tablet by mouth daily.    Marland Kitchen amLODipine (NORVASC) 5 MG tablet Take 1 tablet (5 mg total) daily by mouth. 90 tablet 3  . MEGARED OMEGA-3 KRILL OIL PO Take 1 capsule by mouth.    . ranitidine (ZANTAC) 150 MG tablet Take 1 tablet (150 mg total) by mouth at bedtime as needed for heartburn.      No facility-administered medications prior to visit.      Per HPI unless specifically indicated in ROS section below Review of Systems  Constitutional: Negative for activity change, appetite change, chills, fatigue, fever and unexpected weight change.  HENT: Negative for hearing loss.   Eyes: Negative for visual disturbance.  Respiratory: Negative for cough, chest tightness, shortness of breath and wheezing.   Cardiovascular: Positive for palpitations (mild fluttering when upset) and leg swelling. Negative for chest pain.  Gastrointestinal: Positive for constipation. Negative for abdominal distention, abdominal pain, blood in stool, diarrhea, nausea and vomiting.  Genitourinary: Negative for difficulty urinating and hematuria.  Musculoskeletal: Negative for arthralgias, myalgias and neck pain.  Skin: Negative for rash.       Itchy legs wake him up at night, worse in winter, has tried moisturizing creams  Neurological: Negative for dizziness, seizures, syncope and headaches.  Hematological: Negative for adenopathy. Bruises/bleeds easily.  Psychiatric/Behavioral: Negative for dysphoric mood. The patient is nervous/anxious (irritability).       Objective:    BP 134/78 (BP Location: Right Arm, Patient Position: Sitting, Cuff Size: Large)   Pulse 69   Temp 97.8 F (36.6 C) (Oral)   Ht 6' (1.829 m)   Wt 281 lb 8 oz (127.7 kg)   SpO2 98%   BMI 38.18 kg/m  Wt Readings from Last 3 Encounters:  07/30/18 281 lb 8 oz (127.7 kg)  07/24/17 267 lb (121.1 kg)  02/23/17 293 lb 8 oz (133.1 kg)    Physical Exam  Constitutional: He is oriented to person, place, and time. He appears well-developed and well-nourished. No distress.  HENT:  Head: Normocephalic and atraumatic.  Right Ear: Hearing, tympanic membrane, external ear and ear canal normal.  Left Ear: Hearing, tympanic membrane, external ear and ear canal normal.  Nose: Nose normal.  Mouth/Throat: Uvula is midline, oropharynx is  clear and moist and mucous membranes are normal. No oropharyngeal exudate, posterior oropharyngeal edema or posterior oropharyngeal erythema.  Eyes: Pupils are equal, round, and reactive to light. Conjunctivae and EOM are normal. No scleral icterus.  Neck: Normal range of motion. Neck supple.  Cardiovascular: Normal rate, regular rhythm, normal heart sounds and intact distal pulses.  No murmur heard. Pulses:      Radial pulses are 2+ on the right side, and 2+ on the left side.  Pulmonary/Chest: Effort normal and breath sounds normal. No respiratory distress. He has no wheezes. He has no rales.  Abdominal: Soft. Bowel sounds are normal. He exhibits no distension and no mass. There is no tenderness. There is no rebound and no guarding.  Genitourinary: Rectum normal and prostate normal. Rectal exam shows no external hemorrhoid, no internal hemorrhoid, no fissure, no mass, no tenderness and anal tone normal. Prostate is not enlarged (20gm) and not tender.  Musculoskeletal: Normal range of motion. He exhibits edema (1+ pitting).  2+ DP bilaterally  Lymphadenopathy:    He has no cervical adenopathy.  Neurological: He is alert and oriented to person, place, and time.  CN grossly intact, station and gait intact  Skin: Skin is warm and dry. No rash noted.  Psychiatric: He has a normal mood and affect. His behavior is normal. Judgment and thought content normal.  Nursing note and vitals reviewed.  Results for orders placed or performed in visit on 07/20/18  Microalbumin / creatinine urine ratio  Result Value Ref Range   Microalb, Ur <0.7 0.0 - 1.9 mg/dL   Creatinine,U 125.4 mg/dL   Microalb Creat Ratio 0.6 0.0 - 30.0 mg/g  Basic metabolic panel  Result Value Ref Range   Sodium 141 135 - 145 mEq/L   Potassium 4.1 3.5 - 5.1 mEq/L   Chloride 106 96 - 112 mEq/L   CO2 27 19 - 32 mEq/L   Glucose, Bld 108 (H) 70 - 99 mg/dL   BUN 20 6 - 23 mg/dL   Creatinine, Ser 1.08 0.40 - 1.50 mg/dL   Calcium 8.7  8.4 - 10.5 mg/dL   GFR 74.89 >60.00 mL/min  PSA  Result Value Ref Range   PSA 1.04 0.10 - 4.00 ng/mL  Lipid panel  Result Value Ref Range   Cholesterol 171 0 - 200 mg/dL   Triglycerides 121.0 0.0 - 149.0 mg/dL   HDL 35.80 (L) >39.00 mg/dL   VLDL 24.2 0.0 - 40.0 mg/dL   LDL Cholesterol 111 (H) 0 - 99 mg/dL   Total CHOL/HDL Ratio 5    NonHDL 135.46       Assessment & Plan:   Problem List Items Addressed This Visit    Pedal edema    Anticipate CVI related - and pruritis may be another manifestation. Good pulses - anticipate no arterial insufficiency. Suggested compressoin stockings, Rx provided today.      Obesity, Class II, BMI 35-39.9, no comorbidity    Encouraged healthy  diet and lifestyle changes to affect sustainable weight loss.       Localized swelling, mass and lump, head    Longstanding, unchanging. Anticipate subcutaneous cyst. Desires evaluation for definitive management - will refer to derm      Relevant Orders   Ambulatory referral to Dermatology   Health maintenance examination - Primary    Preventative protocols reviewed and updated unless pt declined. Discussed healthy diet and lifestyle.       Essential hypertension    Chronic, stable. Continue current regimen.       Relevant Medications   amLODipine (NORVASC) 5 MG tablet   Dyslipidemia    Chronic, actually improved over the past year - pt attributes to regular biking. The 10-year ASCVD risk score Mikey Bussing DC Brooke Bonito., et al., 2013) is: 9.4%   Values used to calculate the score:     Age: 9 years     Sex: Male     Is Non-Hispanic African American: No     Diabetic: No     Tobacco smoker: No     Systolic Blood Pressure: 914 mmHg     Is BP treated: Yes     HDL Cholesterol: 35.8 mg/dL     Total Cholesterol: 171 mg/dL           Meds ordered this encounter  Medications  . amLODipine (NORVASC) 5 MG tablet    Sig: Take 1 tablet (5 mg total) by mouth daily.    Dispense:  90 tablet    Refill:  3  .  famotidine (PEPCID) 20 MG tablet    Sig: Take 1 tablet (20 mg total) by mouth at bedtime as needed for heartburn or indigestion.   Orders Placed This Encounter  Procedures  . Ambulatory referral to Dermatology    Referral Priority:   Routine    Referral Type:   Consultation    Referral Reason:   Specialty Services Required    Requested Specialty:   Dermatology    Number of Visits Requested:   1    Follow up plan: Return if symptoms worsen or fail to improve.  Ria Bush, MD

## 2018-07-30 NOTE — Assessment & Plan Note (Signed)
Encouraged healthy diet and lifestyle changes to affect sustainable weight loss.  

## 2018-07-30 NOTE — Assessment & Plan Note (Signed)
Chronic, actually improved over the past year - pt attributes to regular biking. The 10-year ASCVD risk score Mikey Bussing DC Brooke Bonito., et al., 2013) is: 9.4%   Values used to calculate the score:     Age: 57 years     Sex: Male     Is Non-Hispanic African American: No     Diabetic: No     Tobacco smoker: No     Systolic Blood Pressure: 299 mmHg     Is BP treated: Yes     HDL Cholesterol: 35.8 mg/dL     Total Cholesterol: 171 mg/dL

## 2018-07-30 NOTE — Assessment & Plan Note (Signed)
Chronic, stable. Continue current regimen. 

## 2019-07-10 DIAGNOSIS — U071 COVID-19: Secondary | ICD-10-CM

## 2019-07-10 HISTORY — DX: COVID-19: U07.1

## 2019-08-01 ENCOUNTER — Telehealth: Payer: Self-pay

## 2019-08-01 NOTE — Telephone Encounter (Signed)
[facility] Night - Client Nonclinical Telephone Record AccessNurse Client [place] Night - Client Client Site [place] Physician Ria Bush - MD Contact Type Call Who Is Calling Patient / Member / Family / Caregiver Caller Name [place] Phone Number [phone] Patient Name Phillip Miles Patient DOB 2061-08-25 Call Type Message Only Information Provided Reason for Call Request for General Office Information Initial Comment Needs to know when his appt is. Pls call back Additional Comment Disp. Time Disposition Final User 07/30/2019 9:55:34 AM General Information Provided Yes Donato Heinz Call Closed By: Donato Heinz Transaction Date/Time: 07/30/2019 9:53:50 AM (ET)

## 2019-08-01 NOTE — Telephone Encounter (Signed)
Called pt and let him know the dates of his appointments.

## 2019-08-06 ENCOUNTER — Other Ambulatory Visit: Payer: Self-pay | Admitting: Family Medicine

## 2019-08-06 DIAGNOSIS — E785 Hyperlipidemia, unspecified: Secondary | ICD-10-CM

## 2019-08-06 DIAGNOSIS — Z125 Encounter for screening for malignant neoplasm of prostate: Secondary | ICD-10-CM

## 2019-08-06 DIAGNOSIS — I10 Essential (primary) hypertension: Secondary | ICD-10-CM

## 2019-08-08 ENCOUNTER — Other Ambulatory Visit: Payer: 59

## 2019-08-09 ENCOUNTER — Telehealth: Payer: Self-pay | Admitting: Family Medicine

## 2019-08-09 NOTE — Telephone Encounter (Signed)
Spoke with pt/pt's wife, Vinnie Level (on dpr), informing them it looks like surgery was done 05/13/2013.  Says they have already figured out where.  Expressed their thanks.

## 2019-08-09 NOTE — Telephone Encounter (Signed)
Patient's wife called and would like to know if we can look and see what hospital the patient had his gallbladder removed at and what year this was done   Wife is requesting a call back

## 2019-08-10 ENCOUNTER — Emergency Department (HOSPITAL_COMMUNITY): Payer: 59

## 2019-08-10 ENCOUNTER — Other Ambulatory Visit: Payer: Self-pay

## 2019-08-10 ENCOUNTER — Encounter (HOSPITAL_COMMUNITY): Payer: Self-pay | Admitting: Emergency Medicine

## 2019-08-10 ENCOUNTER — Emergency Department (HOSPITAL_COMMUNITY)
Admission: EM | Admit: 2019-08-10 | Discharge: 2019-08-10 | Disposition: A | Discharge status: Home / Self Care | Payer: 59 | Attending: Emergency Medicine | Admitting: Emergency Medicine

## 2019-08-10 DIAGNOSIS — R0602 Shortness of breath: Secondary | ICD-10-CM | POA: Diagnosis present

## 2019-08-10 DIAGNOSIS — Z7982 Long term (current) use of aspirin: Secondary | ICD-10-CM | POA: Insufficient documentation

## 2019-08-10 DIAGNOSIS — U071 COVID-19: Secondary | ICD-10-CM | POA: Insufficient documentation

## 2019-08-10 DIAGNOSIS — Z79899 Other long term (current) drug therapy: Secondary | ICD-10-CM | POA: Diagnosis not present

## 2019-08-10 DIAGNOSIS — I10 Essential (primary) hypertension: Secondary | ICD-10-CM | POA: Diagnosis not present

## 2019-08-10 DIAGNOSIS — Z87891 Personal history of nicotine dependence: Secondary | ICD-10-CM | POA: Insufficient documentation

## 2019-08-10 LAB — BASIC METABOLIC PANEL
Anion gap: 12 (ref 5–15)
BUN: 17 mg/dL (ref 6–20)
CO2: 20 mmol/L — ABNORMAL LOW (ref 22–32)
Calcium: 8.9 mg/dL (ref 8.9–10.3)
Chloride: 100 mmol/L (ref 98–111)
Creatinine, Ser: 1.09 mg/dL (ref 0.61–1.24)
GFR calc Af Amer: >60 mL/min (ref >60)
GFR calc non Af Amer: >60 mL/min (ref >60)
Glucose, Bld: 121 mg/dL — ABNORMAL HIGH (ref 70–99)
Potassium: 3.8 mmol/L (ref 3.5–5.1)
Sodium: 132 mmol/L — ABNORMAL LOW (ref 135–145)

## 2019-08-10 LAB — CBC
HCT: 46.8 % (ref 39.0–52.0)
Hemoglobin: 15.5 g/dL (ref 13.0–17.0)
MCH: 28.9 pg (ref 26.0–34.0)
MCHC: 33.1 g/dL (ref 30.0–36.0)
MCV: 87.2 fL (ref 80.0–100.0)
Platelets: 156 10*3/uL (ref 150–400)
RBC: 5.37 MIL/uL (ref 4.22–5.81)
RDW: 12.5 % (ref 11.5–15.5)
WBC: 4.8 10*3/uL (ref 4.0–10.5)
nRBC: 0 % (ref 0.0–0.2)

## 2019-08-10 LAB — POC SARS CORONAVIRUS 2 AG -  ED: SARS Coronavirus 2 Ag: POSITIVE — AB

## 2019-08-10 LAB — TROPONIN I (HIGH SENSITIVITY): Troponin I (High Sensitivity): 4 ng/L (ref <18)

## 2019-08-10 MED ORDER — SODIUM CHLORIDE 0.9% FLUSH: 3.0000 mL | Freq: Once | INTRAVENOUS | Status: DC

## 2019-08-10 NOTE — ED Triage Notes (Signed)
C/o fever, chills, and body aches x 1 week.  Reports SOB and pressure to center of chestt x 2 days.  Denies cough, nausea, and vomiting.

## 2019-08-10 NOTE — Discharge Instructions (Signed)
Keep drinking plenty of water at home.  Try lying on your stomach as long as you can manage to help improve your lung aeration.  Quarantine until you are symptom-free for 7 days.

## 2019-08-10 NOTE — ED Provider Notes (Signed)
[facility] EMERGENCY DEPARTMENT Provider Note   CSN: [id number] Arrival date & time: 08/10/19  1008     History   Chief Complaint Chief Complaint  Patient presents with  . Shortness of Breath  . Fever  . Chest Pain    HPI Phillip Miles is a 58 y.o. male with a history of hypertension, presenting to the ED with fevers and shortness of breath.  Patient reports he has had 1 week of general fever and myalgias.  Approximate 4 days ago began to feel short of breath and had a cough.  He describes poor appetite, abdominal pain, nausea and diarrhea.  He describes headache and myalgia.  States he feels very unwell.  He reports no other medical issues other than hypertension.  He reports no known drug allergies.  He is not actively smoking.  He denies any history of lung disease.  No hemoptysis or asymmetric LE edema. Patient denies personal or family history of DVT or PE. No recent hormone use (including OCP); travel for >6 hours; prolonged immobilization for greater than 3 days; surgeries or trauma in the last 4 weeks; or malignancy with treatment within 6 months.  He lives with his wife who is not have any infectious symptoms.  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  PSHx;  cholecystectomy      HPI  Past Medical History:  Diagnosis Date  . Acute biliary pancreatitis 2014   s/p cholecystectomy  . Arthritis   . Blood pressure elevated without history of HTN   . Depression    h/o, job related, resolved as of 2013  . Elbow fracture    left  . GERD (gastroesophageal reflux disease)   . Hypertension   . Nocturia   . OSA (obstructive sleep apnea) 07/2014   sleep study in chart - AHI 33, RI 36, lowest sat 76%  . Seasonal allergies     Patient Active Problem List   Diagnosis Date Noted  . Localized swelling, mass and lump, head 07/30/2018  . Pedal edema 07/30/2018  . Dyslipidemia 07/07/2017  . Nonspecific abnormal  electrocardiogram (ECG) (EKG) 10/02/2016  . Essential hypertension 07/15/2016  . Obesity, Class II, BMI 35-39.9, no comorbidity 07/15/2016  . OSA (obstructive sleep apnea) 07/09/2014  . GERD (gastroesophageal reflux disease) 12/03/2011  . Health maintenance examination 12/03/2011    Past Surgical History:  Procedure Laterality Date  . CHOLECYSTECTOMY N/A 05/13/2013   biliary pancreatitis; Gayland Curry, MD  . COLONOSCOPY  01/2012   HP, rpt 10 yrs Carlean Purl)  . REFRACTIVE SURGERY     Lasik [facility]        Home Medications    Prior to Admission medications   Medication Sig Start Date End Date Taking? Authorizing Provider  acetaminophen (TYLENOL) 325 MG tablet Take 650 mg by mouth every 6 (six) hours as needed for pain.     [provider]  amLODipine (NORVASC) 5 MG tablet Take 1 tablet (5 mg total) by mouth daily. 07/30/18   Ria Bush, MD  aspirin EC 81 MG tablet Take 81 mg by mouth daily.    [provider]  famotidine (PEPCID) 20 MG tablet Take 1 tablet (20 mg total) by mouth at bedtime as needed for heartburn or indigestion. 07/30/18   Ria Bush, MD  ibuprofen (ADVIL,MOTRIN) 200 MG tablet Take 200 mg every 6 (six) hours as needed by mouth.    [provider]  Misc Natural  Products (GLUCOSAMINE-CHONDROITIN SULF PO) Take 1 capsule by mouth.    [provider]  Multiple Vitamin (MULTIVITAMIN) tablet Take 1 tablet by mouth daily.    [provider]    Family History Family History  Problem Relation Age of Onset  . Diabetes Mother   . Alcohol abuse Father   . Cancer Father        prostate, lung (smoker)  . CAD Father        MI  . Heart disease Father        pacemaker  . Cirrhosis Maternal Grandfather   . CAD Maternal Grandfather        MI  . Stroke Neg Hx     Social History Social History   Tobacco Use  . Smoking status: Former Smoker    Packs/day: 2.00    Years: 23.00    Pack years: 46.00     Types: Cigarettes    Quit date: 09/08/1997    Years since quitting: 21.9  . Smokeless tobacco: Never Used  Substance Use Topics  . Alcohol use: Yes    Comment: infrequent  . Drug use: No     Allergies   Patient has no known allergies.   Review of Systems Review of Systems  Constitutional: Positive for activity change, appetite change, chills, diaphoresis, fatigue and fever.  Respiratory: Positive for cough and shortness of breath.   Cardiovascular: Negative for chest pain and palpitations.  Gastrointestinal: Positive for abdominal pain, diarrhea and nausea. Negative for vomiting.  Musculoskeletal: Positive for arthralgias and myalgias.  Skin: Negative for pallor and rash.  Neurological: Negative for syncope and light-headedness.  All other systems reviewed and are negative.    Physical Exam Updated Vital Signs BP (!) 144/84   Pulse 72   Temp 100.1 F (37.8 C) (Oral)   Resp 16   SpO2 97%   Physical Exam Vitals signs and nursing note reviewed.  Constitutional:      Appearance: He is well-developed. He is diaphoretic.     Comments: Shaking, chills  HENT:     Head: Normocephalic and atraumatic.  Eyes:     Conjunctiva/sclera: Conjunctivae normal.  Neck:     Musculoskeletal: Neck supple.  Cardiovascular:     Rate and Rhythm: Normal rate and regular rhythm.  Pulmonary:     Effort: Pulmonary effort is normal. No respiratory distress.     Breath sounds: Normal breath sounds.  Abdominal:     Palpations: Abdomen is soft.     Tenderness: There is no abdominal tenderness. Negative signs include Rovsing's sign and McBurney's sign.  Skin:    General: Skin is warm.  Neurological:     General: No focal deficit present.     Mental Status: He is alert and oriented to person, place, and time.  Psychiatric:        Mood and Affect: Mood normal.        Behavior: Behavior normal.      ED Treatments / Results  Labs (all labs ordered are listed, but only abnormal results are  displayed) Labs Reviewed  BASIC METABOLIC PANEL - Abnormal; Notable for the following components:      Result Value   Sodium 132 (*)    CO2 20 (*)    Glucose, Bld 121 (*)    All other components within normal limits  POC SARS CORONAVIRUS 2 AG -  ED - Abnormal; Notable for the following components:   SARS Coronavirus 2 Ag POSITIVE (*)  All other components within normal limits  CBC  TROPONIN I (HIGH SENSITIVITY)    EKG EKG Interpretation  Date/Time:  Wednesday August 10 2019 11:01:49 EST Ventricular Rate:  77 PR Interval:  146 QRS Duration: 84 QT Interval:  372 QTC Calculation: 420 R Axis:   74 Text Interpretation: Sinus rhythm with Premature supraventricular complexes Septal infarct , age undetermined Abnormal ECG No STEMI Confirmed by Octaviano Glow [phone]) on 08/10/2019 2:06:38 PM   Radiology Dg Chest Portable 1 View  Result Date: 08/10/2019 CLINICAL DATA:  Shortness of breath, chest pain EXAM: PORTABLE CHEST 1 VIEW COMPARISON:  03/10/2013 FINDINGS: There is bilateral interstitial thickening with patchy airspace disease at the lung bases likely reflecting atelectasis. There is no focal consolidation. There is no pleural effusion or pneumothorax. The heart and mediastinal contours are unremarkable. There is no acute osseous abnormality. IMPRESSION: Bilateral interstitial thickening with patchy airspace disease at the lung bases likely reflecting atelectasis. Electronically Signed   By: Kathreen Devoid   On: 08/10/2019 15:23    Procedures Procedures (including critical care time)  Medications Ordered in ED Medications  sodium chloride flush (NS) 0.9 % injection 3 mL (has no administration in time range)     Initial Impression / Assessment and Plan / ED Course  I have reviewed the triage vital signs and the nursing notes.  Pertinent labs & imaging results that were available during my care of the patient were reviewed by me and considered in my medical decision making  (see chart for details).  This is a 58 year old previously healthy male presenting to the ED with 1 week of fevers, chills, diarrhea abdominal pain, cough and shortness of breath.  Most concerning for a viral syndrome.  Will tested for COVID-19.  His EKG and troponins are unremarkable, I have a lower suspicion of acute coronary syndrome.  He has no dysuria or hematuria or history of UTIs or kidney stones.  Below history for pyelonephritis.  No abd ttp to suggest intraabdominal infection or pathology as cause of symptoms.  Given his respiratory symptoms, I have a lower suspicion that diverticulitis as a cause of this.  He is afebrile on arrival but his temp is trending upwards.  No leukocytosis.  No SIRS criteria.  My suspicion is for viral infection, but if COVID test is negative will likely need to broaden infectious w/u for sepsis evaluation.  Clinical Course as of Aug 10 1735  Wed Aug 10, 2019  1529 IMPRESSION: Bilateral interstitial thickening with patchy airspace disease at the lung bases likely reflecting atelectasis.   [MT]    Clinical Course User Index [MT] Shonnie Poudrier, Carola Rhine, MD     Final Clinical Impressions(s) / ED Diagnoses   Final diagnoses:  [id number]    ED Discharge Orders    None       Makenzey Nanni, Carola Rhine, MD 08/10/19 1736

## 2019-08-11 NOTE — Telephone Encounter (Signed)
Noted  

## 2019-08-11 NOTE — Telephone Encounter (Addendum)
Pt seen at ER yesterday, diagnosed with Covid.  plz place on covid call list, call tomorrow for update on symptoms and get first day of symptoms.

## 2019-08-12 ENCOUNTER — Encounter: Payer: 59 | Admitting: Family Medicine

## 2019-08-15 NOTE — Telephone Encounter (Signed)
Spoke with wife.  She states he is feeling better each day, today actually woke up feeling much better. Denies significant dyspnea, cough. Still with fatigue and mild headache (treating with tylenol) but overall much better. Still not ready to return to work.  Anticipate continued improvement each day. Advised to let us know if any deterioration in symptoms.

## 2019-08-15 NOTE — Telephone Encounter (Signed)
Spouse( susan) called to let you know pt tested positive Wednesday @ [facility].  Pt is home now fever broke Friday night  Pt is extremely weak out of breath going to bathroom  Sleeps a lot.  Still trying to lots of fluids

## 2019-08-15 NOTE — Telephone Encounter (Signed)
She wanted to know if there was anything else she can do

## 2019-08-15 NOTE — Telephone Encounter (Signed)
suzanne  Phone [phone]

## 2019-08-23 ENCOUNTER — Other Ambulatory Visit: Payer: Self-pay | Admitting: Family Medicine

## 2019-11-21 ENCOUNTER — Telehealth: Payer: Self-pay | Admitting: Family Medicine

## 2019-12-12 ENCOUNTER — Other Ambulatory Visit (INDEPENDENT_AMBULATORY_CARE_PROVIDER_SITE_OTHER): Payer: 59

## 2019-12-12 ENCOUNTER — Other Ambulatory Visit: Payer: Self-pay

## 2019-12-12 DIAGNOSIS — E785 Hyperlipidemia, unspecified: Secondary | ICD-10-CM | POA: Diagnosis not present

## 2019-12-12 DIAGNOSIS — I10 Essential (primary) hypertension: Secondary | ICD-10-CM | POA: Diagnosis not present

## 2019-12-12 DIAGNOSIS — Z125 Encounter for screening for malignant neoplasm of prostate: Secondary | ICD-10-CM | POA: Diagnosis not present

## 2019-12-12 LAB — MICROALBUMIN / CREATININE URINE RATIO
Creatinine,U: 139 mg/dL
Microalb Creat Ratio: 0.6 mg/g (ref 0.0–30.0)
Microalb, Ur: 0.9 mg/dL (ref 0.0–1.9)

## 2019-12-12 LAB — LIPID PANEL
Cholesterol: 175 mg/dL (ref 0–200)
HDL: 32.8 mg/dL — ABNORMAL LOW (ref >39.00)
LDL Cholesterol: 111 mg/dL — ABNORMAL HIGH (ref 0–99)
NonHDL: 142.15
Total CHOL/HDL Ratio: 5
Triglycerides: 154 mg/dL — ABNORMAL HIGH (ref 0.0–149.0)
VLDL: 30.8 mg/dL (ref 0.0–40.0)

## 2019-12-12 LAB — COMPREHENSIVE METABOLIC PANEL
ALT: 21 U/L (ref 0–53)
AST: 17 U/L (ref 0–37)
Albumin: 4.2 g/dL (ref 3.5–5.2)
Alkaline Phosphatase: 72 U/L (ref 39–117)
BUN: 27 mg/dL — ABNORMAL HIGH (ref 6–23)
CO2: 26 mEq/L (ref 19–32)
Calcium: 9 mg/dL (ref 8.4–10.5)
Chloride: 105 mEq/L (ref 96–112)
Creatinine, Ser: 1.12 mg/dL (ref 0.40–1.50)
GFR: 67.24 mL/min (ref >60.00)
Glucose, Bld: 100 mg/dL — ABNORMAL HIGH (ref 70–99)
Potassium: 4.3 mEq/L (ref 3.5–5.1)
Sodium: 140 mEq/L (ref 135–145)
Total Bilirubin: 0.5 mg/dL (ref 0.2–1.2)
Total Protein: 6.6 g/dL (ref 6.0–8.3)

## 2019-12-12 LAB — PSA: PSA: 0.9 ng/mL (ref 0.10–4.00)

## 2019-12-12 MED ORDER — AMLODIPINE BESYLATE 5 MG PO TABS: 5.0000 mg | ORAL_TABLET | Freq: Every day | ORAL | 3 refills | Status: DC

## 2019-12-12 NOTE — Telephone Encounter (Signed)
Rx refilled. Thanks

## 2019-12-12 NOTE — Telephone Encounter (Signed)
Pt was here for labs and wanted to see if you would refill his meds   He will be out before Friday  Best number [phone]  [facility]

## 2019-12-12 NOTE — Addendum Note (Signed)
Addended by: Ria Bush on: 12/12/2019 07:41 AM   Modules accepted: Orders

## 2019-12-16 ENCOUNTER — Encounter: Payer: Self-pay | Admitting: Family Medicine

## 2019-12-16 ENCOUNTER — Other Ambulatory Visit: Payer: Self-pay

## 2019-12-16 ENCOUNTER — Ambulatory Visit (INDEPENDENT_AMBULATORY_CARE_PROVIDER_SITE_OTHER): Payer: 59 | Admitting: Family Medicine

## 2019-12-16 VITALS — BP 128/84 | HR 61 | Temp 97.9°F | Ht 72.0 in | Wt 281.2 lb

## 2019-12-16 DIAGNOSIS — Z0000 Encounter for general adult medical examination without abnormal findings: Secondary | ICD-10-CM

## 2019-12-16 DIAGNOSIS — K219 Gastro-esophageal reflux disease without esophagitis: Secondary | ICD-10-CM

## 2019-12-16 DIAGNOSIS — R600 Localized edema: Secondary | ICD-10-CM

## 2019-12-16 DIAGNOSIS — R4586 Emotional lability: Secondary | ICD-10-CM

## 2019-12-16 DIAGNOSIS — E669 Obesity, unspecified: Secondary | ICD-10-CM

## 2019-12-16 DIAGNOSIS — R002 Palpitations: Secondary | ICD-10-CM

## 2019-12-16 DIAGNOSIS — I10 Essential (primary) hypertension: Secondary | ICD-10-CM

## 2019-12-16 DIAGNOSIS — E785 Hyperlipidemia, unspecified: Secondary | ICD-10-CM

## 2019-12-16 NOTE — Progress Notes (Signed)
This visit was conducted in person.  BP 128/84 (BP Location: Left Arm, Patient Position: Sitting, Cuff Size: Large)   Pulse 61   Temp 97.9 F (36.6 C) (Temporal)   Ht 6' (1.829 m)   Wt 281 lb 4 oz (127.6 kg)   SpO2 97%   BMI 38.14 kg/m    CC: CPE Subjective:    [name], male    DOB: [birth year], 59 y.o.   MRN: [id number]  HPI: Phillip Miles is a 59 y.o. male presenting on 12/16/2019 for Annual Exam   Heartburn - diet related, comes and goes, uses pepcid PRN. Currently on amoxicillin for pending root canal 12/20/2019.  He had covid infection 07/2019, recovered.  Takes aspirin 81mg  daily. Father with CAD/MI.  Ongoing joint pains. Takes osteo bi flex regularly.   Preventative: COLONOSCOPY 01/2012;HP, rpt 10 yrs Carlean Purl) Prostate screening - will do yearly given family history (father) - was estranged  Flu shot yearly Td 2006, Tdap 2019 COVID vaccine - discussed, considering  Shingrix - discussed - states he never had chicken pox  Seat belt use discussed  Sunscreen use discussed. Wears sun shirts. No changing moles on skin.  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 Interim medical history since our last visit reviewed. Allergies and medications reviewed and updated. Outpatient Medications Prior to Visit  Medication Sig Dispense Refill  . acetaminophen (TYLENOL) 325 MG tablet Take 650 mg by mouth every 6 (six) hours as needed for pain.     Marland Kitchen amLODipine (NORVASC) 5 MG tablet Take 1 tablet (5 mg total) by mouth daily. 90 tablet 3  . amoxicillin (AMOXIL) 500 MG tablet Take 500 mg by mouth every 6 (six) hours.     Marland Kitchen aspirin EC 81 MG tablet Take 81 mg by mouth daily.    . famotidine (PEPCID) 20 MG tablet Take 1 tablet (20 mg total) by mouth at bedtime as needed for heartburn or indigestion.    Marland Kitchen ibuprofen (ADVIL,MOTRIN) 200 MG tablet Take 200 mg every 6 (six) hours as needed by mouth.    . Misc Natural Products (GLUCOSAMINE-CHONDROITIN SULF PO) Take 1 capsule by mouth.    . Multiple Vitamin (MULTIVITAMIN) tablet Take 1 tablet by mouth daily.     No facility-administered medications prior to visit.     Per HPI unless specifically indicated in ROS section below Review of Systems  Constitutional: Negative for activity change, appetite change, chills, fatigue, fever and unexpected weight change.  HENT: Positive for congestion (hay fever). Negative for hearing loss.   Eyes: Negative for visual disturbance.  Respiratory: Negative for cough, chest tightness, shortness of breath and wheezing.   Cardiovascular: Positive for palpitations (sometimes heart races when he wakes up - attributes to increases in stress) and leg swelling (chronic L>R x8+ yrs). Negative for chest pain.       No known fmhx blood clots  Gastrointestinal: Positive for constipation (occasional). Negative for abdominal distention, abdominal pain, blood in stool, diarrhea, nausea and vomiting.  Genitourinary: Negative for difficulty urinating and hematuria.  Musculoskeletal: Negative for arthralgias, myalgias and neck pain.  Skin: Negative for rash.  Neurological:  Negative for dizziness, seizures, syncope and headaches.  Hematological: Negative for adenopathy. Does not bruise/bleed easily.  Psychiatric/Behavioral: Positive for dysphoric mood. The patient is nervous/anxious.    Objective:    BP 128/84 (BP Location: Left Arm, Patient Position: Sitting, Cuff Size: Large)   Pulse 61   Temp 97.9 F (36.6 C) (Temporal)   Ht 6' (1.829 m)   Wt 281 lb 4 oz (127.6 kg)   SpO2 97%   BMI 38.14 kg/m   Wt Readings from Last 3 Encounters:    12/16/19 281 lb 4 oz (127.6 kg)  07/30/18 281 lb 8 oz (127.7 kg)  07/24/17 267 lb (121.1 kg)    Physical Exam Vitals and nursing note reviewed.  Constitutional:      General: He is not in acute distress.    Appearance: Normal appearance. He is well-developed. He is not ill-appearing.  HENT:     Head: Normocephalic and atraumatic.     Right Ear: Hearing, tympanic membrane, ear canal and external ear normal.     Left Ear: Hearing, tympanic membrane, ear canal and external ear normal.     Mouth/Throat:     Pharynx: Uvula midline.  Eyes:     General: No scleral icterus.    Conjunctiva/sclera: Conjunctivae normal.     Pupils: Pupils are equal, round, and reactive to light.  Cardiovascular:     Rate and Rhythm: Normal rate and regular rhythm.     Pulses: Normal pulses.          Radial pulses are 2+ on the right side and 2+ on the left side.     Heart sounds: Normal heart sounds. No murmur.  Pulmonary:     Effort: Pulmonary effort is normal. No respiratory distress.     Breath sounds: Normal breath sounds. No wheezing, rhonchi or rales.  Abdominal:     General: Abdomen is flat. Bowel sounds are normal. There is no distension.     Palpations: Abdomen is soft. There is no mass.     Tenderness: There is no abdominal tenderness. There is no guarding or rebound.     Hernia: No hernia is present.  Genitourinary:    Prostate: Normal. Not enlarged (20gm), not tender and no nodules present.     Rectum: Normal. No mass, tenderness, anal fissure, external hemorrhoid or internal hemorrhoid. Normal anal tone.  Musculoskeletal:        General: No tenderness. Normal range of motion.     Cervical back: Normal range of motion and neck supple.     Right lower leg: Edema (tr) present.     Left lower leg: Edema (1+) present.  Lymphadenopathy:     Cervical: No cervical adenopathy.  Skin:    General: Skin is warm and dry.     Findings: No erythema or rash.  Neurological:     General: No focal  deficit present.     Mental Status: He is alert and oriented to person, place, and time.     Comments: CN grossly intact, station and gait intact  Psychiatric:        Mood and Affect: Mood normal.        Behavior: Behavior normal.        Thought Content: Thought content normal.        Judgment: Judgment normal.       Results for orders placed or performed in visit on 12/12/19  Microalbumin / creatinine urine ratio  Result Value Ref Range   Microalb, Ur  0.9 0.0 - 1.9 mg/dL   Creatinine,U 139.0 mg/dL   Microalb Creat Ratio 0.6 0.0 - 30.0 mg/g  PSA  Result Value Ref Range   PSA 0.90 0.10 - 4.00 ng/mL  Comprehensive metabolic panel  Result Value Ref Range   Sodium 140 135 - 145 mEq/L   Potassium 4.3 3.5 - 5.1 mEq/L   Chloride 105 96 - 112 mEq/L   CO2 26 19 - 32 mEq/L   Glucose, Bld 100 (H) 70 - 99 mg/dL   BUN 27 (H) 6 - 23 mg/dL   Creatinine, Ser 1.12 0.40 - 1.50 mg/dL   Total Bilirubin 0.5 0.2 - 1.2 mg/dL   Alkaline Phosphatase 72 39 - 117 U/L   AST 17 0 - 37 U/L   ALT 21 0 - 53 U/L   Total Protein 6.6 6.0 - 8.3 g/dL   Albumin 4.2 3.5 - 5.2 g/dL   GFR 67.24 >60.00 mL/min   Calcium 9.0 8.4 - 10.5 mg/dL  Lipid panel  Result Value Ref Range   Cholesterol 175 0 - 200 mg/dL   Triglycerides 154.0 (H) 0.0 - 149.0 mg/dL   HDL 32.80 (L) >39.00 mg/dL   VLDL 30.8 0.0 - 40.0 mg/dL   LDL Cholesterol 111 (H) 0 - 99 mg/dL   Total CHOL/HDL Ratio 5    NonHDL 142.15    Depression screen [facility] 2/9 12/16/2019 07/30/2018 07/24/2017  Decreased Interest 0 0 2  Down, Depressed, Hopeless 1 0 1  PHQ - 2 Score 1 0 3  Altered sleeping 1 - 0  Tired, decreased energy 2 - 2  Change in appetite 2 - 0  Feeling bad or failure about yourself  1 - 1  Trouble concentrating 1 - 3  Moving slowly or fidgety/restless 0 - 0  Suicidal thoughts 0 - 1  PHQ-9 Score 8 - 10    GAD 7 : Generalized Anxiety Score 12/16/2019  Nervous, Anxious, on Edge 2  Control/stop worrying 1  Worry too much - different things 2   Trouble relaxing 2  Restless 1  Easily annoyed or irritable 2  Afraid - awful might happen 1  Total GAD 7 Score 11   Assessment & Plan:  This visit occurred during the SARS-CoV-2 public health emergency.  Safety protocols were in place, including screening questions prior to the visit, additional usage of staff PPE, and extensive cleaning of exam room while observing appropriate contact time as indicated for disinfecting solutions.   Problem List Items Addressed This Visit    Palpitations    Related to stress. I encouraged he check pulse when this happens, let us know if >120 or if staying persistently elevated for further evaluation.       Obesity, Class II, BMI 35-39.9, no comorbidity    Encouraged healthy diet and lifestyle changes to affect sustainable weight loss.        Mood changes    Some irritability with his competitive nature. Not overwhelming. Encouraged healthy stress relief measures.       Leg edema, left    Chronic L>R, present for years. Has not had this evaluated. No h/o L leg surgeries. Will check baseline venous ultrasound r/o chronic DVT as cause.       Relevant Orders   US Venous Img Lower Unilateral Left   Health maintenance examination - Primary    Preventative protocols reviewed and updated unless pt declined. Discussed healthy diet and lifestyle.       GERD (gastroesophageal reflux disease)  Managed with PRN pepcid, diet      Essential hypertension    Chronic, stable. Continue current regimen.       Dyslipidemia    Chronic, stable period off medication.  The 10-year ASCVD risk score Mikey Bussing DC Brooke Bonito., et al., 2013) is: 10.3%   Values used to calculate the score:     Age: 32 years     Sex: Male     Is Non-Hispanic African American: No     Diabetic: No     Tobacco smoker: No     Systolic Blood Pressure: 0000000 mmHg     Is BP treated: Yes     HDL Cholesterol: 32.8 mg/dL     Total Cholesterol: 175 mg/dL           No orders of the defined  types were placed in this encounter.  Orders Placed This Encounter  Procedures  . US Venous Img Lower Unilateral Left    Standing Status:   Future    Standing Expiration Date:   02/16/2021    Order Specific Question:   Reason for Exam (SYMPTOM  OR DIAGNOSIS REQUIRED)    Answer:   L Leg swelling chronic    Order Specific Question:   Preferred imaging location?    Answer:   [facility]    Patient instructions: Good to see you today Return as needed or in 1 year for next physical.  We will left leg venous ultrasound.  Continue amlodipine 5mg  daily.   Follow up plan: Return in about 1 year (around 12/15/2020) for annual exam, prior fasting for blood work.  Ria Bush, MD

## 2019-12-16 NOTE — Patient Instructions (Addendum)
Good to see you today Return as needed or in 1 year for next physical.  We will left leg venous ultrasound.  Continue amlodipine 5mg  daily.   Health Maintenance, Male Adopting a healthy lifestyle and getting preventive care are important in promoting health and wellness. Ask your health care provider about:  The right schedule for you to have regular tests and exams.  Things you can do on your own to prevent diseases and keep yourself healthy. What should I know about diet, weight, and exercise? Eat a healthy diet   Eat a diet that includes plenty of vegetables, fruits, low-fat dairy products, and lean protein.  Do not eat a lot of foods that are high in solid fats, added sugars, or sodium. Maintain a healthy weight Body mass index (BMI) is a measurement that can be used to identify possible weight problems. It estimates body fat based on height and weight. Your health care provider can help determine your BMI and help you achieve or maintain a healthy weight. Get regular exercise Get regular exercise. This is one of the most important things you can do for your health. Most adults should:  Exercise for at least 150 minutes each week. The exercise should increase your heart rate and make you sweat (moderate-intensity exercise).  Do strengthening exercises at least twice a week. This is in addition to the moderate-intensity exercise.  Spend less time sitting. Even light physical activity can be beneficial. Watch cholesterol and blood lipids Have your blood tested for lipids and cholesterol at 59 years of age, then have this test every 5 years. You may need to have your cholesterol levels checked more often if:  Your lipid or cholesterol levels are high.  You are older than 59 years of age.  You are at high risk for heart disease. What should I know about cancer screening? Many types of cancers can be detected early and may often be prevented. Depending on your health history and  family history, you may need to have cancer screening at various ages. This may include screening for:  Colorectal cancer.  Prostate cancer.  Skin cancer.  Lung cancer. What should I know about heart disease, diabetes, and high blood pressure? Blood pressure and heart disease  High blood pressure causes heart disease and increases the risk of stroke. This is more likely to develop in people who have high blood pressure readings, are of African descent, or are overweight.  Talk with your health care provider about your target blood pressure readings.  Have your blood pressure checked: ? Every 3-5 years if you are 85-41 years of age. ? Every year if you are 57 years old or older.  If you are between the ages of 74 and 42 and are a current or former smoker, ask your health care provider if you should have a one-time screening for abdominal aortic aneurysm (AAA). Diabetes Have regular diabetes screenings. This checks your fasting blood sugar level. Have the screening done:  Once every three years after age 19 if you are at a normal weight and have a low risk for diabetes.  More often and at a younger age if you are overweight or have a high risk for diabetes. What should I know about preventing infection? Hepatitis B If you have a higher risk for hepatitis B, you should be screened for this virus. Talk with your health care provider to find out if you are at risk for hepatitis B infection. Hepatitis C Blood testing is  recommended for:  Everyone born from [birth year] through 1965.  Anyone with known risk factors for hepatitis C. Sexually transmitted infections (STIs)  You should be screened each year for STIs, including gonorrhea and chlamydia, if: ? You are sexually active and are younger than 59 years of age. ? You are older than 59 years of age and your health care provider tells you that you are at risk for this type of infection. ? Your sexual activity has changed since you were  last screened, and you are at increased risk for chlamydia or gonorrhea. Ask your health care provider if you are at risk.  Ask your health care provider about whether you are at high risk for HIV. Your health care provider may recommend a prescription medicine to help prevent HIV infection. If you choose to take medicine to prevent HIV, you should first get tested for HIV. You should then be tested every 3 months for as long as you are taking the medicine. Follow these instructions at home: Lifestyle  Do not use any products that contain nicotine or tobacco, such as cigarettes, e-cigarettes, and chewing tobacco. If you need help quitting, ask your health care provider.  Do not use street drugs.  Do not share needles.  Ask your health care provider for help if you need support or information about quitting drugs. Alcohol use  Do not drink alcohol if your health care provider tells you not to drink.  If you drink alcohol: ? Limit how much you have to 0-2 drinks a day. ? Be aware of how much alcohol is in your drink. In the [place] one drink equals one 12 oz bottle of beer (355 mL), one 5 oz glass of wine (148 mL), or one 1 oz glass of hard liquor (44 mL). General instructions  Schedule regular health, dental, and eye exams.  Stay current with your vaccines.  Tell your health care provider if: ? You often feel depressed. ? You have ever been abused or do not feel safe at home. Summary  Adopting a healthy lifestyle and getting preventive care are important in promoting health and wellness.  Follow your health care provider's instructions about healthy diet, exercising, and getting tested or screened for diseases.  Follow your health care provider's instructions on monitoring your cholesterol and blood pressure. This information is not intended to replace advice given to you by your health care provider. Make sure you discuss any questions you have with your health care  provider. Document Revised: 08/18/2018 Document Reviewed: 08/18/2018 Elsevier Patient Education  2020 [employer].

## 2019-12-16 NOTE — Assessment & Plan Note (Signed)
Chronic, stable. Continue current regimen. 

## 2019-12-16 NOTE — Assessment & Plan Note (Signed)
Preventative protocols reviewed and updated unless pt declined. Discussed healthy diet and lifestyle.  

## 2019-12-18 DIAGNOSIS — F419 Anxiety disorder, unspecified: Secondary | ICD-10-CM | POA: Insufficient documentation

## 2019-12-18 DIAGNOSIS — R454 Irritability and anger: Secondary | ICD-10-CM | POA: Insufficient documentation

## 2019-12-18 DIAGNOSIS — R4586 Emotional lability: Secondary | ICD-10-CM | POA: Insufficient documentation

## 2019-12-18 DIAGNOSIS — R002 Palpitations: Secondary | ICD-10-CM | POA: Insufficient documentation

## 2019-12-18 NOTE — Assessment & Plan Note (Addendum)
Encouraged healthy diet and lifestyle changes to affect sustainable weight loss.  

## 2019-12-18 NOTE — Assessment & Plan Note (Signed)
Some irritability with his competitive nature. Not overwhelming. Encouraged healthy stress relief measures.

## 2019-12-18 NOTE — Assessment & Plan Note (Signed)
Chronic L>R, present for years. Has not had this evaluated. No h/o L leg surgeries. Will check baseline venous ultrasound r/o chronic DVT as cause.

## 2019-12-18 NOTE — Assessment & Plan Note (Signed)
Managed with PRN pepcid, diet

## 2019-12-18 NOTE — Assessment & Plan Note (Signed)
Chronic, stable period off medication.  The 10-year ASCVD risk score Mikey Bussing DC Brooke Bonito., et al., 2013) is: 10.3%   Values used to calculate the score:     Age: 59 years     Sex: Male     Is Non-Hispanic African American: No     Diabetic: No     Tobacco smoker: No     Systolic Blood Pressure: 0000000 mmHg     Is BP treated: Yes     HDL Cholesterol: 32.8 mg/dL     Total Cholesterol: 175 mg/dL

## 2019-12-18 NOTE — Assessment & Plan Note (Addendum)
Related to stress. I encouraged he check pulse when this happens, let us know if >120 or if staying persistently elevated for further evaluation.

## 2019-12-23 ENCOUNTER — Ambulatory Visit
Admission: RE | Admit: 2019-12-23 | Discharge: 2019-12-23 | Disposition: A | Discharge status: Home / Self Care | Payer: 59 | Source: Ambulatory Visit | Attending: Family Medicine | Admitting: Family Medicine

## 2019-12-23 ENCOUNTER — Other Ambulatory Visit: Payer: Self-pay

## 2019-12-23 DIAGNOSIS — R600 Localized edema: Secondary | ICD-10-CM | POA: Insufficient documentation

## 2020-06-01 ENCOUNTER — Ambulatory Visit: Payer: Self-pay

## 2020-06-01 ENCOUNTER — Encounter: Payer: Self-pay | Admitting: Family Medicine

## 2020-06-01 ENCOUNTER — Other Ambulatory Visit: Payer: Self-pay

## 2020-06-01 ENCOUNTER — Ambulatory Visit (INDEPENDENT_AMBULATORY_CARE_PROVIDER_SITE_OTHER): Payer: 59 | Admitting: Family Medicine

## 2020-06-01 DIAGNOSIS — M25531 Pain in right wrist: Secondary | ICD-10-CM

## 2020-06-01 DIAGNOSIS — G8929 Other chronic pain: Secondary | ICD-10-CM

## 2020-06-01 DIAGNOSIS — M25511 Pain in right shoulder: Secondary | ICD-10-CM | POA: Diagnosis not present

## 2020-06-01 NOTE — Progress Notes (Signed)
Office Visit Note   Patient: Phillip Miles           Date of Birth: [birth year]           MRN: [id number] Visit Date: 06/01/2020 Requested by: Ria Bush, MD [place],  [place] [id number] PCP: Ria Bush, MD  Subjective: Chief Complaint  Patient presents with  . Right Wrist - Pain    Intermittent pain x couple years. Hurts all the time now. He thinks this is coming from bowling. Right-hand dominant.  . Right Shoulder - Pain    Pain started 1-2 months ago - just woke up 1 morning with pain. Hurts worse in the morning. No problems with ROM. 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    HPI: He is here with right shoulder and wrist pain.  He is right-hand dominant.  Right wrist has bothered him for couple years.  Intermittently at first, but now it hurts most days.  Pain on the radial side and the ulnar side with certain movements.  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  He uses a wrist brace which helps, and ibuprofen gives him some relief.  His right shoulder started bothering him about 3 months ago, he woke up 1 day with pain on the anterior aspect.  Now it bothers him pretty much every morning when he wakes up.  He gets better through the day.  No personal or family history of gout.  He is otherwise in good health.               ROS:   All other systems were reviewed and are negative.  Objective: Vital Signs: There were no vitals taken for this visit.  Physical Exam:  General:  Alert and oriented, in no acute distress. Pulm:  Breathing unlabored. Psy:  Normal mood, congruent affect. Skin: No erythema Right shoulder: Full active range of motion with no adhesive capsulitis.  Tender at the [facility] joint and pain with AC crossover test.  Isometric rotator cuff strength is 5/5 throughout with no significant pain.  No tenderness in the subacromial space or over the long head biceps  tendon. Right wrist: He has stiffness with flexion and extension compared to the left, 60 degrees of dorsiflexion and about 45 degrees of volar flexion compared to 75 and 60 on the left.  No significant synovitis.  There is minimal tenderness on the dorsum of the wrist.  Negative Tinel's at the carpal tunnel.  Mild tenderness near the ECU tendon.    Imaging: XR Wrist Complete Right  Result Date: 06/01/2020 X-rays of the right wrist reveal ulnar minus variance.  There is degenerative spurring at the proximal lunate, and joint space narrowing at the lunate capitate joint.  No sign of AVN.  Otherwise mild degenerative changes diffusely.  XR Shoulder 1V Right  Result Date: 06/01/2020 AP right shoulder reveals inferior spurring at the glenohumeral joint, and moderate AC joint arthropathy with joint space narrowing, periarticular spurring.  No soft tissue calcifications.   Assessment & Plan: 1.  Chronic right wrist pain with stiffness and early DJD -He will try Voltaren gel, glucosamine and turmeric.  He will work on range of motion and grip strength at home.  If he fails to improve he will contact me for a referral to hand therapy.  2.  Right shoulder AC joint arthropathy -Same  treatment as above.  Injection if he fails to improve.     Procedures: No procedures performed  No notes on file     PMFS History: Patient Active Problem List   Diagnosis Date Noted  . Mood changes 12/18/2019  . Palpitations 12/18/2019  . Localized swelling, mass and lump, head 07/30/2018  . Leg edema, left 07/30/2018  . Dyslipidemia 07/07/2017  . Nonspecific abnormal electrocardiogram (ECG) (EKG) 10/02/2016  . Essential hypertension 07/15/2016  . Obesity, Class II, BMI 35-39.9, no comorbidity 07/15/2016  . OSA (obstructive sleep apnea) 07/09/2014  . GERD (gastroesophageal reflux disease) 12/03/2011  . Health maintenance examination 12/03/2011   Past Medical History:  Diagnosis Date  . Acute biliary  pancreatitis 2014   s/p cholecystectomy  . Arthritis   . Blood pressure elevated without history of HTN   . COVID-19 virus infection 07/2019  . Depression    h/o, job related, resolved as of 2013  . Elbow fracture    left  . GERD (gastroesophageal reflux disease)   . Hypertension   . Nocturia   . OSA (obstructive sleep apnea) 07/2014   sleep study in chart - AHI 33, RI 36, lowest sat 76%  . Seasonal allergies     Family History  Problem Relation Age of Onset  . Diabetes Mother   . Alcohol abuse Father   . Cancer Father        prostate, lung (smoker)  . CAD Father        MI  . Heart disease Father        pacemaker  . Cirrhosis Maternal Grandfather   . CAD Maternal Grandfather        MI  . Stroke Neg Hx     Past Surgical History:  Procedure Laterality Date  . CHOLECYSTECTOMY N/A 05/13/2013   biliary pancreatitis; Gayland Curry, MD  . COLONOSCOPY  01/2012   HP, rpt 10 yrs Carlean Purl)  . REFRACTIVE SURGERY     Lasik [facility]   Social History   Occupational History  . 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 Smoking status: Former Smoker    Packs/day: 2.00    Years: 23.00    Pack years: 46.00    Types: Cigarettes    Quit date: 09/08/1997    Years since quitting: 22.7  . Smokeless tobacco: Never Used  Substance and Sexual Activity  . Alcohol use: Yes    Comment: infrequent  . Drug use: No  . Sexual activity: Not on file

## 2020-06-01 NOTE — Patient Instructions (Signed)
     Turmeric:  500 mg twice daily  Glucosamine:  1,000-1,500 mg twice daily

## 2020-10-17 IMAGING — DX DG CHEST 1V PORT
1 series · 1 of 1 positions shown · non-contrast
Comparison: 03/10/2013

CLINICAL DATA: Shortness of breath, chest pain

EXAM:
PORTABLE CHEST 1 VIEW

[chest ap]
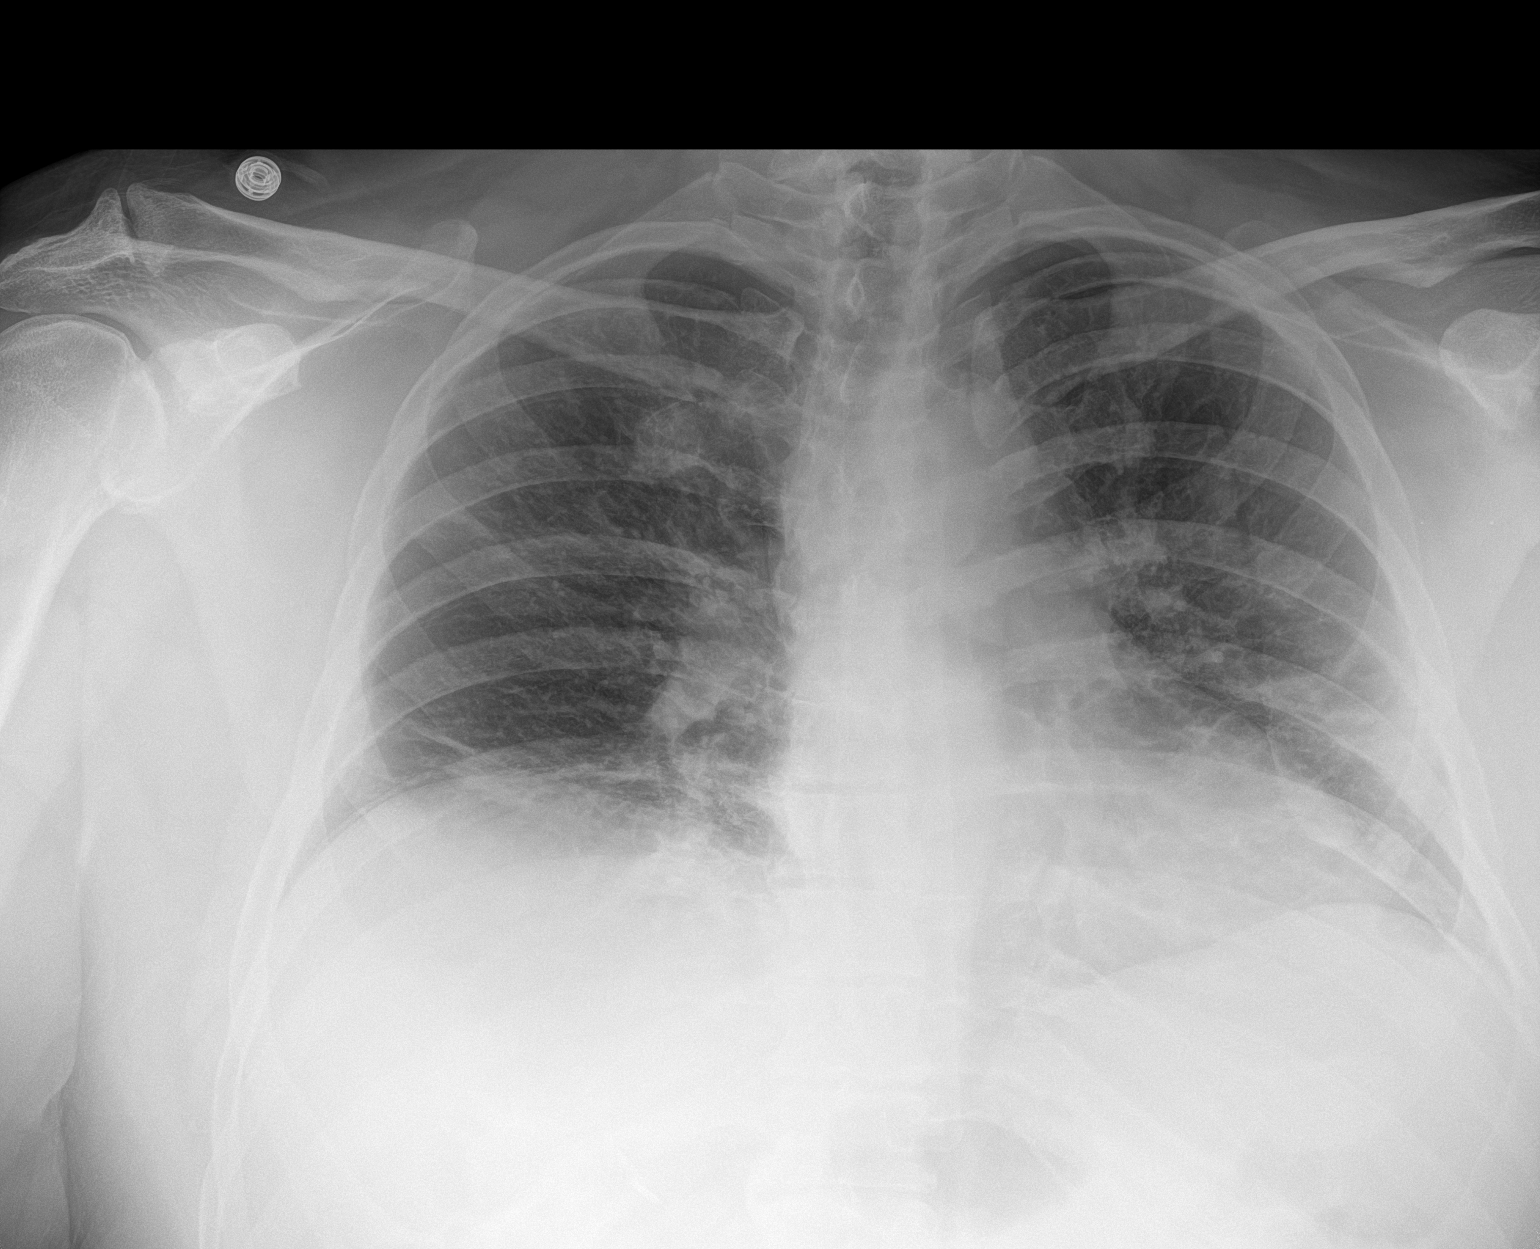

[1 of 1 positions shown; findings below may reference images not displayed]

FINDINGS: There is bilateral interstitial thickening with patchy airspace
disease at the lung bases likely reflecting atelectasis. There is no
focal consolidation. There is no pleural effusion or pneumothorax.
The heart and mediastinal contours are unremarkable.

There is no acute osseous abnormality.
IMPRESSION: Bilateral interstitial thickening with patchy airspace disease at
the lung bases likely reflecting atelectasis.

## 2020-12-21 ENCOUNTER — Other Ambulatory Visit: Payer: 59

## 2020-12-28 ENCOUNTER — Encounter: Payer: 59 | Admitting: Family Medicine

## 2021-01-10 ENCOUNTER — Other Ambulatory Visit: Payer: Self-pay | Admitting: Family Medicine

## 2021-01-10 DIAGNOSIS — E785 Hyperlipidemia, unspecified: Secondary | ICD-10-CM

## 2021-01-10 DIAGNOSIS — I10 Essential (primary) hypertension: Secondary | ICD-10-CM

## 2021-01-10 DIAGNOSIS — Z125 Encounter for screening for malignant neoplasm of prostate: Secondary | ICD-10-CM

## 2021-01-10 DIAGNOSIS — Z1159 Encounter for screening for other viral diseases: Secondary | ICD-10-CM

## 2021-01-11 ENCOUNTER — Other Ambulatory Visit: Payer: Self-pay

## 2021-01-11 ENCOUNTER — Other Ambulatory Visit (INDEPENDENT_AMBULATORY_CARE_PROVIDER_SITE_OTHER): Payer: 59

## 2021-01-11 DIAGNOSIS — Z125 Encounter for screening for malignant neoplasm of prostate: Secondary | ICD-10-CM

## 2021-01-11 DIAGNOSIS — Z1159 Encounter for screening for other viral diseases: Secondary | ICD-10-CM

## 2021-01-11 DIAGNOSIS — E785 Hyperlipidemia, unspecified: Secondary | ICD-10-CM | POA: Diagnosis not present

## 2021-01-11 DIAGNOSIS — I10 Essential (primary) hypertension: Secondary | ICD-10-CM

## 2021-01-11 LAB — COMPREHENSIVE METABOLIC PANEL
ALT: 23 U/L (ref 0–53)
AST: 18 U/L (ref 0–37)
Albumin: 4.1 g/dL (ref 3.5–5.2)
Alkaline Phosphatase: 81 U/L (ref 39–117)
BUN: 27 mg/dL — ABNORMAL HIGH (ref 6–23)
CO2: 27 mEq/L (ref 19–32)
Calcium: 9.1 mg/dL (ref 8.4–10.5)
Chloride: 105 mEq/L (ref 96–112)
Creatinine, Ser: 1.32 mg/dL (ref 0.40–1.50)
GFR: 59.03 mL/min — ABNORMAL LOW (ref >60.00)
Glucose, Bld: 106 mg/dL — ABNORMAL HIGH (ref 70–99)
Potassium: 4.3 mEq/L (ref 3.5–5.1)
Sodium: 140 mEq/L (ref 135–145)
Total Bilirubin: 0.5 mg/dL (ref 0.2–1.2)
Total Protein: 6.7 g/dL (ref 6.0–8.3)

## 2021-01-11 LAB — LIPID PANEL
Cholesterol: 185 mg/dL (ref 0–200)
HDL: 31.9 mg/dL — ABNORMAL LOW (ref >39.00)
LDL Cholesterol: 127 mg/dL — ABNORMAL HIGH (ref 0–99)
NonHDL: 153.2
Total CHOL/HDL Ratio: 6
Triglycerides: 132 mg/dL (ref 0.0–149.0)
VLDL: 26.4 mg/dL (ref 0.0–40.0)

## 2021-01-11 LAB — MICROALBUMIN / CREATININE URINE RATIO
Creatinine,U: 81.6 mg/dL
Microalb Creat Ratio: 0.9 mg/g (ref 0.0–30.0)
Microalb, Ur: <0.7 mg/dL (ref 0.0–1.9)

## 2021-01-11 LAB — PSA: PSA: 1.19 ng/mL (ref 0.10–4.00)

## 2021-01-14 LAB — HEPATITIS C ANTIBODY
Hepatitis C Ab: NONREACTIVE
SIGNAL TO CUT-OFF: 0 (ref <1.00)

## 2021-01-18 ENCOUNTER — Ambulatory Visit (INDEPENDENT_AMBULATORY_CARE_PROVIDER_SITE_OTHER): Payer: 59 | Admitting: Family Medicine

## 2021-01-18 ENCOUNTER — Other Ambulatory Visit: Payer: Self-pay

## 2021-01-18 ENCOUNTER — Encounter: Payer: Self-pay | Admitting: Family Medicine

## 2021-01-18 VITALS — BP 138/82 | HR 63 | Temp 97.5°F | Ht 72.0 in | Wt 295.2 lb

## 2021-01-18 DIAGNOSIS — I10 Essential (primary) hypertension: Secondary | ICD-10-CM | POA: Diagnosis not present

## 2021-01-18 DIAGNOSIS — E785 Hyperlipidemia, unspecified: Secondary | ICD-10-CM

## 2021-01-18 DIAGNOSIS — Z23 Encounter for immunization: Secondary | ICD-10-CM | POA: Diagnosis not present

## 2021-01-18 DIAGNOSIS — R600 Localized edema: Secondary | ICD-10-CM

## 2021-01-18 DIAGNOSIS — G4733 Obstructive sleep apnea (adult) (pediatric): Secondary | ICD-10-CM

## 2021-01-18 DIAGNOSIS — R454 Irritability and anger: Secondary | ICD-10-CM

## 2021-01-18 DIAGNOSIS — Z0000 Encounter for general adult medical examination without abnormal findings: Secondary | ICD-10-CM | POA: Diagnosis not present

## 2021-01-18 DIAGNOSIS — R9431 Abnormal electrocardiogram [ECG] [EKG]: Secondary | ICD-10-CM

## 2021-01-18 MED ORDER — TURMERIC 500 MG PO CAPS: 1.0000 | ORAL_CAPSULE | Freq: Three times a day (TID) | ORAL | Status: DC | PRN

## 2021-01-18 MED ORDER — AMLODIPINE BESYLATE 5 MG PO TABS: 5.0000 mg | ORAL_TABLET | Freq: Every day | ORAL | 3 refills | Status: DC

## 2021-01-18 MED ORDER — OMEPRAZOLE 40 MG PO CPDR: 40.0000 mg | DELAYED_RELEASE_CAPSULE | Freq: Every day | ORAL | 3 refills | Status: DC | PRN

## 2021-01-18 NOTE — Progress Notes (Signed)
[name], male    DOB: 1960/09/22, 60 y.o.   MRN: [id number]  This visit was conducted in person.  BP 138/82   Pulse 63   Temp (!) 97.5 F (36.4 C) (Temporal)   Ht 6' (1.829 m)   Wt 295 lb 4 oz (133.9 kg)   SpO2 97%   BMI 40.04 kg/m    CC: CPE  Subjective:   HPI: Phillip Miles is a 60 y.o. male presenting on 01/18/2021 for Annual Exam   Heartburn - diet related, comes and goes, uses prilosec pretty regularly. Caffeine - limited but drinks significant soda. Alcohol - limited. NSAIDs - takes when he bowls. Also takes turmeric.  Weight gain noted.  Takes aspirin 81mg  daily given fmhx - father with CAD/MI.  Notes worsening knee pains. Takes osteo bi flex regularly, tried turmeric without benefit. Does better when standing and moving, worse trouble with prolonged sitting.  Chronic L leg swelling - LLE venous US 12/2019 WNL   OSA - not on CPAP. Sleep study in chart 2017. Previously improved with weight loss. Now noted weight gain.   Preventative: COLONOSCOPY 01/2012;HP, rpt 10 yrs Carlean Purl)  Prostate screening - will do yearly given family history (father)  Lung cancer screening - not eligible  Flu shot yearly  [place] 09/2020, 10/2020  Td 2006, Tdap 2019 Shingrix - discussed - states he never had chicken pox  Seat belt use discussed  Sunscreen use discussed. Wears sun shirts. No changing moles on skin.  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 Interim medical history since our last visit reviewed. Allergies and medications reviewed and updated. Outpatient Medications Prior to Visit  Medication Sig Dispense Refill  .  acetaminophen (TYLENOL) 325 MG tablet Take 650 mg by mouth every 6 (six) hours as needed for pain.     Marland Kitchen aspirin EC 81 MG tablet Take 81 mg by mouth daily.    Marland Kitchen ibuprofen (ADVIL,MOTRIN) 200 MG tablet Take 200 mg every 6 (six) hours as needed by mouth.    . loratadine (CLARITIN) 10 MG tablet Take 10 mg by mouth daily as needed for allergies.    . Misc Natural Products (GLUCOSAMINE-CHONDROITIN SULF PO) Take 1 capsule by mouth.    . Multiple Vitamin (MULTIVITAMIN) tablet Take 1 tablet by mouth daily.    Marland Kitchen amLODipine (NORVASC) 5 MG tablet Take 1 tablet (5 mg total) by mouth daily. 90 tablet 3  . famotidine (PEPCID) 20 MG tablet Take 1 tablet (20 mg total) by mouth at bedtime as needed for heartburn or indigestion.     No facility-administered medications prior to visit.     Per HPI unless specifically indicated in ROS section below Review of Systems  Constitutional: Negative for activity change, appetite change, chills, fatigue, fever and unexpected weight change.  HENT: Negative for hearing loss.   Eyes: Negative for visual disturbance.  Respiratory: Positive for chest tightness (intermittent, not exertional, can come on at rest). Negative for cough, shortness of breath and wheezing.   Cardiovascular: Positive for leg swelling (chronic L side). Negative for chest pain and palpitations.  Gastrointestinal:  Positive for constipation. Negative for abdominal distention, abdominal pain, blood in stool, diarrhea, nausea and vomiting.  Genitourinary: Negative for difficulty urinating and hematuria.  Musculoskeletal: Negative for arthralgias, myalgias and neck pain.  Skin: Negative for rash.  Neurological: Positive for numbness (to feet when in bed). Negative for dizziness, seizures, syncope and headaches.  Hematological: Negative for adenopathy. Does not bruise/bleed easily.  Psychiatric/Behavioral: Positive for dysphoric mood. The patient is nervous/anxious.    Objective:  BP 138/82   Pulse 63    Temp (!) 97.5 F (36.4 C) (Temporal)   Ht 6' (1.829 m)   Wt 295 lb 4 oz (133.9 kg)   SpO2 97%   BMI 40.04 kg/m   Wt Readings from Last 3 Encounters:  01/18/21 295 lb 4 oz (133.9 kg)  12/16/19 281 lb 4 oz (127.6 kg)  07/30/18 281 lb 8 oz (127.7 kg)      Physical Exam Vitals and nursing note reviewed.  Constitutional:      General: He is not in acute distress.    Appearance: Normal appearance. He is well-developed. He is obese. He is not ill-appearing.  HENT:     Head: Normocephalic and atraumatic.     Right Ear: Hearing, tympanic membrane, ear canal and external ear normal.     Left Ear: Hearing, tympanic membrane, ear canal and external ear normal.  Eyes:     General: No scleral icterus.    Extraocular Movements: Extraocular movements intact.     Conjunctiva/sclera: Conjunctivae normal.     Pupils: Pupils are equal, round, and reactive to light.  Neck:     Thyroid: No thyroid mass or thyromegaly.  Cardiovascular:     Rate and Rhythm: Normal rate and regular rhythm.     Pulses: Normal pulses.          Radial pulses are 2+ on the right side and 2+ on the left side.     Heart sounds: Normal heart sounds. No murmur heard.   Pulmonary:     Effort: Pulmonary effort is normal. No respiratory distress.     Breath sounds: Normal breath sounds. No wheezing, rhonchi or rales.  Abdominal:     General: Bowel sounds are normal. There is no distension.     Palpations: Abdomen is soft. There is no mass.     Tenderness: There is no abdominal tenderness. There is no guarding or rebound.     Hernia: No hernia is present.  Musculoskeletal:        General: Normal range of motion.     Cervical back: Normal range of motion and neck supple.     Right lower leg: No edema.     Left lower leg: No edema.  Lymphadenopathy:     Cervical: No cervical adenopathy.  Skin:    General: Skin is warm and dry.     Findings: No rash.  Neurological:     General: No focal deficit present.     Mental  Status: He is alert and oriented to person, place, and time.     Comments: CN grossly intact, station and gait intact  Psychiatric:        Mood and Affect: Mood normal.        Behavior: Behavior normal.        Thought Content: Thought content normal.        Judgment: Judgment normal.       Results for orders placed or performed in visit on 01/11/21  Hepatitis C antibody  Result Value Ref Range   Hepatitis C Ab NON-REACTIVE NON-REACTIVE   SIGNAL TO CUT-OFF 0.00 <1.00  Microalbumin / creatinine urine ratio  Result Value Ref Range   Microalb, Ur <0.7 0.0 - 1.9 mg/dL   Creatinine,U 81.6 mg/dL   Microalb Creat Ratio 0.9 0.0 - 30.0 mg/g  PSA  Result Value Ref Range   PSA 1.19 0.10 - 4.00 ng/mL  Comprehensive metabolic panel  Result Value Ref Range   Sodium 140 135 - 145 mEq/L   Potassium 4.3 3.5 - 5.1 mEq/L   Chloride 105 96 - 112 mEq/L   CO2 27 19 - 32 mEq/L   Glucose, Bld 106 (H) 70 - 99 mg/dL   BUN 27 (H) 6 - 23 mg/dL   Creatinine, Ser 1.32 0.40 - 1.50 mg/dL   Total Bilirubin 0.5 0.2 - 1.2 mg/dL   Alkaline Phosphatase 81 39 - 117 U/L   AST 18 0 - 37 U/L   ALT 23 0 - 53 U/L   Total Protein 6.7 6.0 - 8.3 g/dL   Albumin 4.1 3.5 - 5.2 g/dL   GFR 59.03 (L) >60.00 mL/min   Calcium 9.1 8.4 - 10.5 mg/dL  Lipid panel  Result Value Ref Range   Cholesterol 185 0 - 200 mg/dL   Triglycerides 132.0 0.0 - 149.0 mg/dL   HDL 31.90 (L) >39.00 mg/dL   VLDL 26.4 0.0 - 40.0 mg/dL   LDL Cholesterol 127 (H) 0 - 99 mg/dL   Total CHOL/HDL Ratio 6    NonHDL 153.20    EKG - NSR 60, normal axis, intervals, no hypertrophy or acute ST/T changes, Q waves anteroseptally Assessment & Plan:  This visit occurred during the SARS-CoV-2 public health emergency.  Safety protocols were in place, including screening questions prior to the visit, additional usage of staff PPE, and extensive cleaning of exam room while observing appropriate contact time as indicated for disinfecting solutions.   Problem List  Items Addressed This Visit    Health maintenance examination - Primary    Preventative protocols reviewed and updated unless pt declined. Discussed healthy diet and lifestyle.       Essential hypertension    Chronic, adequate - continue amlodipine 5mg  daily.  Update EKG       Relevant Medications   amLODipine (NORVASC) 5 MG tablet   Other Relevant Orders   EKG 12-Lead (Completed)   Obesity, morbid, BMI 40.0-49.9 ([place])    Reviewed weight gain noted to date.  Reviewed healthy diet and lifestyle choices to affect sustainable weight loss. Reviewed effect of obesity on chronic medical conditions.       OSA (obstructive sleep apnea)    Not on CPAP. Previously improved with weight loss. Now with noted weight gain concern for symptomatic OSA encouraged return to pulm to discuss treatment options - he declines, would prefer to work towards weight loss.       Nonspecific abnormal electrocardiogram (ECG) (EKG)    Possible persistent Q waves anteroseptally  Will refer to cardiology for further risk stratification - may benefit from statin.       Relevant Orders   Ambulatory referral to Cardiology   Dyslipidemia    Chronic, off meds. Discussed ASCVD risk - he declines statin, will work towards diet/lifestyle choices to improve cholesterol levels namely LDL. Mediterranean diet recommended, handout provided.  The 10-year ASCVD risk score Mikey Bussing DC Jr., et al., 2013) is: 13.6%   Values used to calculate the score:     Age: 89 years  Sex: Male     Is Non-Hispanic African American: No     Diabetic: No     Tobacco smoker: No     Systolic Blood Pressure: 341 mmHg     Is BP treated: Yes     HDL Cholesterol: 31.9 mg/dL     Total Cholesterol: 185 mg/dL       Pedal edema   Irritability    Stable period off medication.        Other Visit Diagnoses    Need for shingles vaccine       Relevant Orders   Varicella-zoster vaccine IM (Completed)       Meds ordered this encounter   Medications  . amLODipine (NORVASC) 5 MG tablet    Sig: Take 1 tablet (5 mg total) by mouth daily.    Dispense:  90 tablet    Refill:  3  . omeprazole (PRILOSEC) 40 MG capsule    Sig: Take 1 capsule (40 mg total) by mouth daily as needed (GERD symptoms).    Dispense:  30 capsule    Refill:  3  . Turmeric 500 MG CAPS    Sig: Take 1 tablet by mouth 3 (three) times daily as needed.   Orders Placed This Encounter  Procedures  . Varicella-zoster vaccine IM  . Ambulatory referral to Cardiology    Referral Priority:   Routine    Referral Type:   Consultation    Referral Reason:   Specialty Services Required    Requested Specialty:   Cardiology    Number of Visits Requested:   1  . EKG 12-Lead    Patient instructions: EKG today  First shingrix shot today. Schedule nurse visit in 2-6 months to complete the series. Consider return to pulmonology to discuss sleep apnea  Work on healthy diet and weight loss - increase water.  Good to see you today.  Return in 1 year for next physical.  I recommend mediterranean diet - see below.   Follow up plan: Return in about 1 year (around 01/18/2022) for annual exam, prior fasting for blood work.  Ria Bush, MD

## 2021-01-18 NOTE — Assessment & Plan Note (Signed)
Reviewed weight gain noted to date.  Reviewed healthy diet and lifestyle choices to affect sustainable weight loss. Reviewed effect of obesity on chronic medical conditions.

## 2021-01-18 NOTE — Assessment & Plan Note (Signed)
He has been taking PRN OTC PPI with ongoing breakthrough symptoms.  Reviewed diet choices to control GERD.  Will Rx omeprazole 40mg  daily PRN. Reviewed possible adverse effects of med on kidneys and GI absorption.

## 2021-01-18 NOTE — Assessment & Plan Note (Addendum)
Chronic, adequate - continue amlodipine 5mg  daily.  Update EKG

## 2021-01-18 NOTE — Assessment & Plan Note (Signed)
Not on CPAP. Previously improved with weight loss. Now with noted weight gain concern for symptomatic OSA encouraged return to pulm to discuss treatment options - he declines, would prefer to work towards weight loss.

## 2021-01-18 NOTE — Assessment & Plan Note (Signed)
Preventative protocols reviewed and updated unless pt declined. Discussed healthy diet and lifestyle.  

## 2021-01-18 NOTE — Patient Instructions (Addendum)
EKG today  First shingrix shot today. Schedule nurse visit in 2-6 months to complete the seires. Consider return to pulmonology to discuss sleep apnea  Work on healthy diet and weight loss - increase water.  Good to see you today.  Return in 1 year for next physical.  I recommend mediterranean diet - see below.   Mediterranean Diet A Mediterranean diet refers to food and lifestyle choices that are based on the traditions of countries located on the [employer]. This way of eating has been shown to help prevent certain conditions and improve outcomes for people who have chronic diseases, like kidney disease and heart disease. What are tips for following this plan? Lifestyle  Cook and eat meals together with your family, when possible.  Drink enough fluid to keep your urine clear or pale yellow.  Be physically active every day. This includes: ? Aerobic exercise like running or swimming. ? Leisure activities like gardening, walking, or housework.  Get 7-8 hours of sleep each night.  If recommended by your health care provider, drink red wine in moderation. This means 1 glass a day for nonpregnant women and 2 glasses a day for men. A glass of wine equals 5 oz (150 mL). Reading food labels  Check the serving size of packaged foods. For foods such as rice and pasta, the serving size refers to the amount of cooked product, not dry.  Check the total fat in packaged foods. Avoid foods that have saturated fat or trans fats.  Check the ingredients list for added sugars, such as corn syrup.   Shopping  At the grocery store, buy most of your food from the areas near the walls of the store. This includes: ? Fresh fruits and vegetables (produce). ? Grains, beans, nuts, and seeds. Some of these may be available in unpackaged forms or large amounts (in bulk). ? Fresh seafood. ? Poultry and eggs. ? Low-fat dairy products.  Buy whole ingredients instead of prepackaged foods.  Buy fresh  fruits and vegetables in-season from local farmers markets.  Buy frozen fruits and vegetables in resealable bags.  If you do not have access to quality fresh seafood, buy precooked frozen shrimp or canned fish, such as tuna, salmon, or sardines.  Buy small amounts of raw or cooked vegetables, salads, or olives from the deli or salad bar at your store.  Stock your pantry so you always have certain foods on hand, such as olive oil, canned tuna, canned tomatoes, rice, pasta, and beans. Cooking  Cook foods with extra-virgin olive oil instead of using butter or other vegetable oils.  Have meat as a side dish, and have vegetables or grains as your main dish. This means having meat in small portions or adding small amounts of meat to foods like pasta or stew.  Use beans or vegetables instead of meat in common dishes like chili or lasagna.  Experiment with different cooking methods. Try roasting or broiling vegetables instead of steaming or sauteing them.  Add frozen vegetables to soups, stews, pasta, or rice.  Add nuts or seeds for added healthy fat at each meal. You can add these to yogurt, salads, or vegetable dishes.  Marinate fish or vegetables using olive oil, lemon juice, garlic, and fresh herbs. Meal planning  Plan to eat 1 vegetarian meal one day each week. Try to work up to 2 vegetarian meals, if possible.  Eat seafood 2 or more times a week.  Have healthy snacks readily available, such as: ? Vegetable  sticks with hummus. ? [place] yogurt. ? Fruit and nut trail mix.  Eat balanced meals throughout the week. This includes: ? Fruit: 2-3 servings a day ? Vegetables: 4-5 servings a day ? Low-fat dairy: 2 servings a day ? Fish, poultry, or lean meat: 1 serving a day ? Beans and legumes: 2 or more servings a week ? Nuts and seeds: 1-2 servings a day ? Whole grains: 6-8 servings a day ? Extra-virgin olive oil: 3-4 servings a day  Limit red meat and sweets to only a few servings  a month   What are my food choices?  Mediterranean diet ? Recommended  Grains: Whole-grain pasta. Brown rice. Bulgar wheat. Polenta. Couscous. Whole-wheat bread. [place].  Vegetables: Artichokes. Beets. Broccoli. Cabbage. Carrots. Eggplant. Green beans. Chard. Kale. Spinach. Onions. Leeks. Peas. Squash. Tomatoes. Peppers. Radishes.  Fruits: Apples. Apricots. Avocado. Berries. Bananas. Cherries. Dates. Figs. Grapes. Lemons. Melon. Oranges. Peaches. Plums. Pomegranate.  Meats and other protein foods: Beans. Almonds. Sunflower seeds. Pine nuts. Peanuts. [place]. Salmon. Scallops. Shrimp. [place]. Tilapia. Clams. Oysters. Eggs.  Dairy: Low-fat milk. Cheese. Greek yogurt.  Beverages: Water. Red wine. Herbal tea.  Fats and oils: Extra virgin olive oil. Avocado oil. Grape seed oil.  Sweets and desserts: [place] yogurt with honey. Baked apples. Poached pears. Trail mix.  Seasoning and other foods: Basil. Cilantro. Coriander. Cumin. Mint. Parsley. Sage. Rosemary. Tarragon. Garlic. Oregano. Thyme. Pepper. Balsalmic vinegar. Tahini. Hummus. Tomato sauce. Olives. Mushrooms. ? Limit these  Grains: Prepackaged pasta or rice dishes. Prepackaged cereal with added sugar.  Vegetables: Deep fried potatoes (french fries).  Fruits: Fruit canned in syrup.  Meats and other protein foods: Beef. Pork. Lamb. Poultry with skin. Hot dogs. [place] Salines.  Dairy: Ice cream. Sour cream. Whole milk.  Beverages: Juice. Sugar-sweetened soft drinks. Beer. Liquor and spirits.  Fats and oils: Butter. Canola oil. Vegetable oil. Beef fat (tallow). Lard.  Sweets and desserts: Cookies. Cakes. Pies. Candy.  Seasoning and other foods: Mayonnaise. Premade sauces and marinades. The items listed may not be a complete list. Talk with your dietitian about what dietary choices are right for you. Summary  The Mediterranean diet includes both food and lifestyle choices.  Eat a variety of fresh fruits and vegetables, beans, nuts,  seeds, and whole grains.  Limit the amount of red meat and sweets that you eat.  Talk with your health care provider about whether it is safe for you to drink red wine in moderation. This means 1 glass a day for nonpregnant women and 2 glasses a day for men. A glass of wine equals 5 oz (150 mL). This information is not intended to replace advice given to you by your health care provider. Make sure you discuss any questions you have with your health care provider. Document Revised: 04/24/2016 Document Reviewed: 04/17/2016 Elsevier Patient Education  [place].

## 2021-01-18 NOTE — Assessment & Plan Note (Addendum)
Chronic, off meds. Discussed ASCVD risk - he declines statin, will work towards diet/lifestyle choices to improve cholesterol levels namely LDL. Mediterranean diet recommended, handout provided.  The 10-year ASCVD risk score Mikey Bussing DC Brooke Bonito., et al., 2013) is: 13.6%   Values used to calculate the score:     Age: 60 years     Sex: Male     Is Non-Hispanic African American: No     Diabetic: No     Tobacco smoker: No     Systolic Blood Pressure: 233 mmHg     Is BP treated: Yes     HDL Cholesterol: 31.9 mg/dL     Total Cholesterol: 185 mg/dL

## 2021-01-19 NOTE — Assessment & Plan Note (Signed)
Stable period off medication.  

## 2021-01-19 NOTE — Assessment & Plan Note (Signed)
Possible persistent Q waves anteroseptally  Will refer to cardiology for further risk stratification - may benefit from statin.

## 2021-02-22 ENCOUNTER — Ambulatory Visit (INDEPENDENT_AMBULATORY_CARE_PROVIDER_SITE_OTHER): Payer: 59 | Admitting: Cardiology

## 2021-02-22 ENCOUNTER — Encounter: Payer: Self-pay | Admitting: Cardiology

## 2021-02-22 ENCOUNTER — Other Ambulatory Visit: Payer: Self-pay

## 2021-02-22 VITALS — BP 130/84 | HR 62 | Ht 72.0 in | Wt 298.0 lb

## 2021-02-22 DIAGNOSIS — R9431 Abnormal electrocardiogram [ECG] [EKG]: Secondary | ICD-10-CM | POA: Diagnosis not present

## 2021-02-22 DIAGNOSIS — I10 Essential (primary) hypertension: Secondary | ICD-10-CM | POA: Diagnosis not present

## 2021-02-22 NOTE — Progress Notes (Signed)
Cardiology Office Note:    Date:  02/22/2021   ID:  Phillip Miles, DOB 06-Feb-1961, MRN [id number]  PCP:  Ria Bush, MD   [facility] Providers Cardiologist:  None     Referring MD: Ria Bush, MD   Chief Complaint  Patient presents with   New Patient (Initial Visit)    Referred by PCP for Abnormal EKG. Meds reviewed verbally with patient.    Phillip Miles is a 60 y.o. male who is being seen today for the evaluation of abnormal ECG at the request of Ria Bush, MD.   History of Present Illness:    Phillip Miles is a 60 y.o. male with a hx of hypertension, former smoker x10 to 15 years who presents due to abnormal ECG.  Patient saw primary care provider on 01/19/2021 for scheduled visit.  EKG obtained at the time showed anteroseptal Q waves.  He denies chest pain or shortness of breath.  States exercises occasionally on his exercise bike without chest pain symptoms.  Has a family history of MI in his father, unsure of age.  Had an echocardiogram in 2018 showing normal systolic function, EF 55 to 60%, no wall motion abnormalities.  He feels well, has no concerns at this time.  Past Medical History:  Diagnosis Date   Acute biliary pancreatitis 2014   s/p cholecystectomy   Arthritis    Blood pressure elevated without history of HTN    COVID-19 virus infection 07/2019   Depression    h/o, job related, resolved as of 2013   Elbow fracture    left   GERD (gastroesophageal reflux disease)    Hypertension    Nocturia    OSA (obstructive sleep apnea) 07/2014   sleep study in chart - AHI 33, RI 36, lowest sat 76%   Seasonal allergies     Past Surgical History:  Procedure Laterality Date   CHOLECYSTECTOMY N/A 05/13/2013   biliary pancreatitis; Gayland Curry, MD   COLONOSCOPY  01/2012   HP, rpt 10 yrs Carlean Purl)   REFRACTIVE SURGERY     Lasik [facility]    Current Medications: Current Meds  Medication Sig   acetaminophen (TYLENOL) 325 MG  tablet Take 650 mg by mouth every 6 (six) hours as needed for pain.    amLODipine (NORVASC) 5 MG tablet Take 1 tablet (5 mg total) by mouth daily.   aspirin EC 81 MG tablet Take 81 mg by mouth daily.   ibuprofen (ADVIL,MOTRIN) 200 MG tablet Take 200 mg every 6 (six) hours as needed by mouth.   loratadine (CLARITIN) 10 MG tablet Take 10 mg by mouth daily as needed for allergies.   Misc Natural Products (GLUCOSAMINE-CHONDROITIN SULF PO) Take 1 capsule by mouth.   Multiple Vitamin (MULTIVITAMIN) tablet Take 1 tablet by mouth daily.   omeprazole (PRILOSEC) 40 MG capsule Take 1 capsule (40 mg total) by mouth daily as needed (GERD symptoms).   Turmeric 500 MG CAPS Take 1 tablet by mouth 3 (three) times daily as needed.     Allergies:   Patient has no known allergies.   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    Employer: [employer]  Tobacco Use   Smoking status: Former  Packs/day: 2.00    Years: 23.00    Pack years: 46.00    Types: Cigarettes    Quit date: 09/08/1997    Years since quitting: 23.4   Smokeless tobacco: Never  Substance and Sexual Activity   Alcohol use: Yes    Comment: infrequent   Drug use: No   Sexual activity: Not on file  Other Topics Concern   Not on file  Social History Narrative   Married 1991, no kids   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, 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, 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 There is no history of Stroke.  ROS:   Please see the history of present illness.     All other systems reviewed and are negative.  EKGs/Labs/Other Studies Reviewed:    The following studies were reviewed today:   EKG:  EKG is  ordered today.  The ekg ordered today demonstrates sinus rhythm, possible septal infarct.  Recent Labs: 01/11/2021: ALT 23; BUN 27; Creatinine, Ser 1.32; Potassium 4.3; Sodium 140  Recent Lipid Panel    Component Value Date/Time   CHOL 185 01/11/2021 0749   CHOL 167 07/11/2014 0000   TRIG 132.0 01/11/2021 0749   TRIG 175 07/11/2014 0000   HDL 31.90 (L) 01/11/2021 0749   CHOLHDL 6 01/11/2021 0749   VLDL 26.4 01/11/2021 0749   LDLCALC 127 (H) 01/11/2021 0749   LDLCALC 104 07/11/2014 0000   LDLDIRECT 126.0 07/17/2017 0836     Risk Assessment/Calculations:          Physical Exam:    VS:  BP 130/84 (BP Location: Left Arm, Patient Position: Sitting, Cuff Size: Large)   Pulse 62   Ht 6' (1.829 m)   Wt 298 lb (135.2 kg)   SpO2 98%   BMI 40.42 kg/m     Wt Readings from Last 3 Encounters:  02/22/21 298 lb (135.2 kg)  01/18/21 295 lb 4 oz (133.9 kg)  12/16/19 281 lb 4 oz (127.6 kg)     GEN:  Well nourished, well developed in no acute distress HEENT: Normal NECK: No JVD; No carotid bruits LYMPHATICS: No lymphadenopathy CARDIAC: RRR, no murmurs, rubs, gallops RESPIRATORY:  Clear to auscultation without rales, wheezing or rhonchi  ABDOMEN: Soft, non-tender, non-distended MUSCULOSKELETAL:  No edema; No deformity  SKIN: Warm and dry NEUROLOGIC:  Alert and oriented x 3 PSYCHIATRIC:  Normal affect   ASSESSMENT:    1. Abnormal EKG   2. Primary hypertension    PLAN:    In order of problems listed above:  EKG showing septal infarct.  I reviewed  patient's EKGs as far back as 2014 which showed possible old septal infarct.  EKG abnormalities not acute.  He had an echocardiogram in 2018 showing normal systolic function with no wall motion abnormalities.  Patient is clinically asymptomatic, no chest pain or shortness of breath.  No indication for additional testing as he is asymptomatic and EKG changes are  not acute.  Echo also was normal.  We will consider stress or additional testing if patient becomes symptomatic. Hypertension, BP controlled.  Continue amlodipine.  Follow-up as needed     Medication Adjustments/Labs and Tests Ordered: Current medicines are reviewed at length with the patient today.  Concerns regarding medicines are outlined above.  Orders Placed This Encounter  Procedures   EKG 12-Lead    No orders of the defined types were placed in this encounter.   Patient Instructions  Medication Instructions:  Your physician recommends that you continue on your current medications as directed. Please refer to the Current Medication list given to you today.  *If you need a refill on your cardiac medications before your next appointment, please call your pharmacy*   Lab Work: None ordered If you have labs (blood work) drawn today and your tests are completely normal, you will receive your results only by: [facility] (if you have MyChart) OR A paper copy in the mail If you have any lab test that is abnormal or we need to change your treatment, we will call you to review the results.   Testing/Procedures: None ordered   Follow-Up: At [facility], you and your health needs are our priority.  As part of our continuing mission to provide you with exceptional heart care, we have created designated Provider Care Teams.  These Care Teams include your primary Cardiologist (physician) and Advanced Practice Providers (APPs -  Physician Assistants and Nurse Practitioners) who all work together to provide you with the care  you need, when you need it.  We recommend signing up for the patient portal called "MyChart".  Sign up information is provided on this After Visit Summary.  MyChart is used to connect with patients for Virtual Visits (Telemedicine).  Patients are able to view lab/test results, encounter notes, upcoming appointments, etc.  Non-urgent messages can be sent to your provider as well.   To learn more about what you can do with MyChart, go to [email or web address].    Your next appointment:   Follow up as needed   The format for your next appointment:   In Person  Provider:   Kate Sable, MD   Other Instructions    Signed, Kate Sable, MD  02/22/2021 11:27 AM    [place]

## 2021-02-22 NOTE — Patient Instructions (Signed)

## 2021-04-22 ENCOUNTER — Other Ambulatory Visit: Payer: Self-pay | Admitting: Family Medicine

## 2021-04-23 ENCOUNTER — Ambulatory Visit (INDEPENDENT_AMBULATORY_CARE_PROVIDER_SITE_OTHER): Payer: 59

## 2021-04-23 ENCOUNTER — Other Ambulatory Visit: Payer: Self-pay

## 2021-04-23 DIAGNOSIS — Z23 Encounter for immunization: Secondary | ICD-10-CM | POA: Diagnosis not present

## 2021-04-23 NOTE — Progress Notes (Signed)
Per orders of Dr. Ria Bush, injection of Shingles #2  given by Francella Solian in left deltoid. Patient tolerated injection well. Patient has completed shingles immunizations. I have updated [facility] as well as given VIS to patient.

## 2021-07-10 ENCOUNTER — Ambulatory Visit (INDEPENDENT_AMBULATORY_CARE_PROVIDER_SITE_OTHER): Payer: 59 | Admitting: Dermatology

## 2021-07-10 ENCOUNTER — Other Ambulatory Visit: Payer: Self-pay

## 2021-07-10 DIAGNOSIS — L02212 Cutaneous abscess of back [any part, except buttock]: Secondary | ICD-10-CM

## 2021-07-10 DIAGNOSIS — L720 Epidermal cyst: Secondary | ICD-10-CM | POA: Diagnosis not present

## 2021-07-10 DIAGNOSIS — L0291 Cutaneous abscess, unspecified: Secondary | ICD-10-CM

## 2021-07-10 DIAGNOSIS — D170 Benign lipomatous neoplasm of skin and subcutaneous tissue of head, face and neck: Secondary | ICD-10-CM

## 2021-07-10 MED ORDER — DOXYCYCLINE HYCLATE 100 MG PO CAPS: 100.0000 mg | ORAL_CAPSULE | Freq: Two times a day (BID) | ORAL | 0 refills | Status: AC

## 2021-07-10 MED ORDER — MUPIROCIN 2 % EX OINT: 1.0000 "application " | TOPICAL_OINTMENT | Freq: Every day | CUTANEOUS | 1 refills | Status: DC

## 2021-07-10 NOTE — Patient Instructions (Addendum)
If you have any questions or concerns for your doctor, please call our main line at [phone] and press option 4 to reach your doctor's medical assistant. If no one answers, please leave a voicemail as directed and we will return your call as soon as possible. Messages left after 4 pm will be answered the following business day.   You may also send us a message via MyChart. We typically respond to MyChart messages within 1-2 business days.  For prescription refills, please ask your pharmacy to contact our office. Our fax number is [phone].  If you have an urgent issue when the clinic is closed that cannot wait until the next business day, you can page your doctor at the number below.    Please note that while we do our best to be available for urgent issues outside of office hours, we are not available 24/7.   If you have an urgent issue and are unable to reach us, you may choose to seek medical care at your doctor's office, retail clinic, urgent care center, or emergency room.  If you have a medical emergency, please immediately call 911 or go to the emergency department.  Pager Numbers  - Dr. Kowalski: [phone]  - Dr. Moye: [phone]  - Dr. Stewart: [phone]  In the event of inclement weather, please call our main line at [phone] for an update on the status of any delays or closures.  Dermatology Medication Tips: Please keep the boxes that topical medications come in in order to help keep track of the instructions about where and how to use these. Pharmacies typically print the medication instructions only on the boxes and not directly on the medication tubes.   If your medication is too expensive, please contact our office at [phone] option 4 or send us a message through MyChart.   We are unable to tell what your co-pay for medications will be in advance as this is different depending on your insurance coverage. However, we may be able to find a substitute  medication at lower cost or fill out paperwork to get insurance to cover a needed medication.   If a prior authorization is required to get your medication covered by your insurance company, please allow us 1-2 business days to complete this process.  Drug prices often vary depending on where the prescription is filled and some pharmacies may offer cheaper prices.  The website www.goodrx.com contains coupons for medications through different pharmacies. The prices here do not account for what the cost may be with help from insurance (it may be cheaper with your insurance), but the website can give you the price if you did not use any insurance.  - You can print the associated coupon and take it with your prescription to the pharmacy.  - You may also stop by our office during regular business hours and pick up a GoodRx coupon card.  - If you need your prescription sent electronically to a different pharmacy, notify our office through [facility] MyChart or by phone at [phone] option 4.   Wound Care Instructions  Cleanse wound gently with soap and water once a day then pat dry with clean gauze. Apply a thing coat of Petrolatum (petroleum jelly, "Vaseline") over the wound (unless you have an allergy to this). We recommend that you use a new, sterile tube of Vaseline. Do not pick or remove scabs. Do not remove the yellow or white "healing tissue" from the base of the wound.  Cover the   wound with fresh, clean, nonstick gauze and secure with paper tape. You may use Band-Aids in place of gauze and tape if the would is small enough, but would recommend trimming much of the tape off as there is often too much. Sometimes Band-Aids can irritate the skin.  You should call the office for your biopsy report after 1 week if you have not already been contacted.  If you experience any problems, such as abnormal amounts of bleeding, swelling, significant bruising, significant pain, or evidence of infection,  please call the office immediately.  FOR ADULT SURGERY PATIENTS: If you need something for pain relief you may take 1 extra strength Tylenol (acetaminophen) AND 2 Ibuprofen (200mg each) together every 4 hours as needed for pain. (do not take these if you are allergic to them or if you have a reason you should not take them.) Typically, you may only need pain medication for 1 to 3 days.    

## 2021-07-10 NOTE — Progress Notes (Signed)
   New Patient Visit  Subjective  Phillip Miles is a 60 y.o. male who presents for the following: growth (Back, opened up and drained last night has had for 4 days) and Mass (L forehead, yrs).  The following portions of the chart were reviewed this encounter and updated as appropriate:   Tobacco  Allergies  Meds  Problems  Med Hx  Surg Hx  Fam Hx     Review of Systems:  No other skin or systemic complaints except as noted in HPI or Assessment and Plan.  Objective  Well appearing patient in no apparent distress; mood and affect are within normal limits.  A focused examination was performed including back, face. Relevant physical exam findings are noted in the Assessment and Plan.  L forehead 3.2cm Rubbery nodule with possible bony prominence          Left Upper Back 3.0cm cystic pap   Assessment & Plan  Lipoma of face L forehead With possible bony callus vs vascular growth. Benign appearing - no change Discussed removal, discussed risk of recurrence  Ruptured epidermal cyst with abscess formation-complicated Left Upper Back  Start Doxycycline 100mg  1 po bid for 1 week, take with food and drink Start Mupirocin oint qd  Doxycycline should be taken with food to prevent nausea. Do not lay down for 30 minutes after taking. Be cautious with sun exposure and use good sun protection while on this medication. Pregnant women should not take this medication.    Incision and Drainage - Left Upper Back -complicated Location: L upper back  Informed Consent: Discussed risks (permanent scarring, light or dark discoloration, infection, pain, bleeding, bruising, redness, damage to adjacent structures, and recurrence of the lesion) and benefits of the procedure, as well as the alternatives.  Informed consent was obtained.  Preparation: The area was prepped with alcohol.  Anesthesia: Lidocaine 1% with epinephrine, bupivicaine  Procedure Details: An incision was made overlying  the lesion. The lesion drained white, chalky cyst material and blood.  A large amount of fluid was drained.   Pieces of cyst wall were extracted.    Antibiotic ointment and a sterile pressure dressing were applied. The patient tolerated procedure well.  Total number of lesions drained: 1  Plan: The patient was instructed on post-op care. Recommend OTC analgesia as needed for pain.  doxycycline (VIBRAMYCIN) 100 MG capsule - Left Upper Back Take 1 capsule (100 mg total) by mouth 2 (two) times daily for 7 days. Take with food and drink  mupirocin ointment (BACTROBAN) 2 % - Left Upper Back Apply 1 application topically daily. Qd to cyst on back  Return in about 6 months (around 01/07/2022) for recheck cyst, TBSE.  I, Othelia Pulling, RMA, am acting as scribe for Sarina Ser, MD . Documentation: I have reviewed the above documentation for accuracy and completeness, and I agree with the above.  Sarina Ser, MD

## 2021-07-11 ENCOUNTER — Encounter: Payer: Self-pay | Admitting: Dermatology

## 2022-01-16 ENCOUNTER — Other Ambulatory Visit: Payer: Self-pay | Admitting: Family Medicine

## 2022-01-16 DIAGNOSIS — I10 Essential (primary) hypertension: Secondary | ICD-10-CM

## 2022-01-16 DIAGNOSIS — E785 Hyperlipidemia, unspecified: Secondary | ICD-10-CM

## 2022-01-16 DIAGNOSIS — Z125 Encounter for screening for malignant neoplasm of prostate: Secondary | ICD-10-CM

## 2022-01-16 NOTE — Progress Notes (Signed)
Labs ordered.

## 2022-01-17 ENCOUNTER — Other Ambulatory Visit (INDEPENDENT_AMBULATORY_CARE_PROVIDER_SITE_OTHER): Payer: 59

## 2022-01-17 DIAGNOSIS — I10 Essential (primary) hypertension: Secondary | ICD-10-CM | POA: Diagnosis not present

## 2022-01-17 DIAGNOSIS — E785 Hyperlipidemia, unspecified: Secondary | ICD-10-CM | POA: Diagnosis not present

## 2022-01-17 DIAGNOSIS — Z125 Encounter for screening for malignant neoplasm of prostate: Secondary | ICD-10-CM

## 2022-01-17 LAB — COMPREHENSIVE METABOLIC PANEL
ALT: 21 U/L (ref 0–53)
AST: 16 U/L (ref 0–37)
Albumin: 4 g/dL (ref 3.5–5.2)
Alkaline Phosphatase: 96 U/L (ref 39–117)
BUN: 27 mg/dL — ABNORMAL HIGH (ref 6–23)
CO2: 29 mEq/L (ref 19–32)
Calcium: 9.2 mg/dL (ref 8.4–10.5)
Chloride: 104 mEq/L (ref 96–112)
Creatinine, Ser: 1.19 mg/dL (ref 0.40–1.50)
GFR: 66.38 mL/min (ref >60.00)
Glucose, Bld: 98 mg/dL (ref 70–99)
Potassium: 4.5 mEq/L (ref 3.5–5.1)
Sodium: 139 mEq/L (ref 135–145)
Total Bilirubin: 0.4 mg/dL (ref 0.2–1.2)
Total Protein: 6.6 g/dL (ref 6.0–8.3)

## 2022-01-17 LAB — MICROALBUMIN / CREATININE URINE RATIO
Creatinine,U: 57.7 mg/dL
Microalb Creat Ratio: 1.2 mg/g (ref 0.0–30.0)
Microalb, Ur: <0.7 mg/dL (ref 0.0–1.9)

## 2022-01-17 LAB — LIPID PANEL
Cholesterol: 177 mg/dL (ref 0–200)
HDL: 42.1 mg/dL
LDL Cholesterol: 117 mg/dL — ABNORMAL HIGH (ref 0–99)
NonHDL: 134.93
Total CHOL/HDL Ratio: 4
Triglycerides: 91 mg/dL (ref 0.0–149.0)
VLDL: 18.2 mg/dL (ref 0.0–40.0)

## 2022-01-17 LAB — PSA: PSA: 0.96 ng/mL (ref 0.10–4.00)

## 2022-01-22 ENCOUNTER — Ambulatory Visit (INDEPENDENT_AMBULATORY_CARE_PROVIDER_SITE_OTHER): Payer: 59 | Admitting: Dermatology

## 2022-01-22 DIAGNOSIS — L918 Other hypertrophic disorders of the skin: Secondary | ICD-10-CM

## 2022-01-22 DIAGNOSIS — L817 Pigmented purpuric dermatosis: Secondary | ICD-10-CM | POA: Diagnosis not present

## 2022-01-22 DIAGNOSIS — L720 Epidermal cyst: Secondary | ICD-10-CM

## 2022-01-22 DIAGNOSIS — Z1283 Encounter for screening for malignant neoplasm of skin: Secondary | ICD-10-CM | POA: Diagnosis not present

## 2022-01-22 DIAGNOSIS — L578 Other skin changes due to chronic exposure to nonionizing radiation: Secondary | ICD-10-CM

## 2022-01-22 DIAGNOSIS — D229 Melanocytic nevi, unspecified: Secondary | ICD-10-CM

## 2022-01-22 DIAGNOSIS — L821 Other seborrheic keratosis: Secondary | ICD-10-CM

## 2022-01-22 DIAGNOSIS — Z872 Personal history of diseases of the skin and subcutaneous tissue: Secondary | ICD-10-CM

## 2022-01-22 DIAGNOSIS — L814 Other melanin hyperpigmentation: Secondary | ICD-10-CM

## 2022-01-22 DIAGNOSIS — D1800 Hemangioma unspecified site: Secondary | ICD-10-CM

## 2022-01-22 DIAGNOSIS — I872 Venous insufficiency (chronic) (peripheral): Secondary | ICD-10-CM

## 2022-01-22 NOTE — Progress Notes (Signed)
   Follow-Up Visit   Subjective  Phillip Miles is a 61 y.o. male who presents for the following: Follow-up (History of abscess of left upper back - The patient presents for Total-Body Skin Exam (TBSE) for skin cancer screening and mole check.  The patient has spots, moles and lesions to be evaluated, some may be new or changing and the patient has concerns that these could be cancer./).  The following portions of the chart were reviewed this encounter and updated as appropriate:   Tobacco  Allergies  Meds  Problems  Med Hx  Surg Hx  Fam Hx     Review of Systems:  No other skin or systemic complaints except as noted in HPI or Assessment and Plan.  Objective  Well appearing patient in no apparent distress; mood and affect are within normal limits.  A full examination was performed including scalp, head, eyes, ears, nose, lips, neck, chest, axillae, abdomen, back, buttocks, bilateral upper extremities, bilateral lower extremities, hands, feet, fingers, toes, fingernails, and toenails. All findings within normal limits unless otherwise noted below.  Left Upper Back Subcutaneous nodule.    Assessment & Plan   Acrochordons (Skin Tags) - Fleshy, skin-colored pedunculated papules - Benign appearing.  - Observe. - If desired, they can be removed with an in office procedure that is not covered by insurance. - Please call the clinic if you notice any new or changing lesions.  Lentigines - Scattered tan macules - Due to sun exposure - Benign-appearing, observe - Recommend daily broad spectrum sunscreen SPF 30+ to sun-exposed areas, reapply every 2 hours as needed. - Call for any changes  Seborrheic Keratoses - Stuck-on, waxy, tan-brown papules and/or plaques  - Benign-appearing - Discussed benign etiology and prognosis. - Observe - Call for any changes  Melanocytic Nevi - Tan-brown and/or pink-flesh-colored symmetric macules and papules - Benign appearing on exam today -  Observation - Call clinic for new or changing moles - Recommend daily use of broad spectrum spf 30+ sunscreen to sun-exposed areas.   Hemangiomas - Red papules - Discussed benign nature - Observe - Call for any changes  Actinic Damage - Chronic condition, secondary to cumulative UV/sun exposure - diffuse scaly erythematous macules with underlying dyspigmentation - Recommend daily broad spectrum sunscreen SPF 30+ to sun-exposed areas, reapply every 2 hours as needed.  - Staying in the shade or wearing long sleeves, sun glasses (UVA+UVB protection) and wide brim hats (4-inch brim around the entire circumference of the hat) are also recommended for sun protection.  - Call for new or changing lesions.  Skin cancer screening performed today.  Epidermal inclusion cyst Left Upper Back With history of abscess -  Discussed no treatment vs excision.   Schamberg's purpura with stasis dermatitis Legs Benign. Discussed graduated stockings daily Stasis in the legs causes chronic leg swelling, which may result in itchy or painful rashes, skin discoloration, skin texture changes, and sometimes ulceration.  Recommend daily graduated compression hose/stockings- easiest to put on first thing in morning, remove at bedtime.  Elevate legs as much as possible. Avoid salt/sodium rich foods.  Skin cancer screening  Return in about 2 years (around 01/23/2024).  I, Ashok Cordia, CMA, am acting as scribe for Sarina Ser, MD . Documentation: I have reviewed the above documentation for accuracy and completeness, and I agree with the above.  Sarina Ser, MD

## 2022-01-22 NOTE — Patient Instructions (Signed)
Discussed graduated stockings daily ? ? ?If You Need Anything After Your Visit ? ?If you have any questions or concerns for your doctor, please call our main line at [phone] and press option 4 to reach your doctor's medical assistant. If no one answers, please leave a voicemail as directed and we will return your call as soon as possible. Messages left after 4 pm will be answered the following business day.  ? ?You may also send [place] a message via MyChart. We typically respond to MyChart messages within 1-2 business days. ? ?For prescription refills, please ask your pharmacy to contact our office. Our fax number is [phone]. ? ?If you have an urgent issue when the clinic is closed that cannot wait until the next business day, you can page your doctor at the number below.   ? ?Please note that while we do our best to be available for urgent issues outside of office hours, we are not available 24/7.  ? ?If you have an urgent issue and are unable to reach [place], you may choose to seek medical care at your doctor's office, retail clinic, urgent care center, or emergency room. ? ?If you have a medical emergency, please immediately call 911 or go to the emergency department. ? ?Pager Numbers ? ?- Dr. Nehemiah Massed: [phone] ? ?- Dr. Laurence Ferrari: [phone] ? ?- Dr. Nicole Kindred: [phone] ? ?In the event of inclement weather, please call our main line at [phone] for an update on the status of any delays or closures. ? ?Dermatology Medication Tips: ?Please keep the boxes that topical medications come in in order to help keep track of the instructions about where and how to use these. Pharmacies typically print the medication instructions only on the boxes and not directly on the medication tubes.  ? ?If your medication is too expensive, please contact our office at [phone] option 4 or send [place] a message through [place].  ? ?We are unable to tell what your co-pay for medications will be in advance as this is different  depending on your insurance coverage. However, we may be able to find a substitute medication at lower cost or fill out paperwork to get insurance to cover a needed medication.  ? ?If a prior authorization is required to get your medication covered by your insurance company, please allow [place] 1-2 business days to complete this process. ? ?Drug prices often vary depending on where the prescription is filled and some pharmacies may offer cheaper prices. ? ?The website www.goodrx.com contains coupons for medications through different pharmacies. The prices here do not account for what the cost may be with help from insurance (it may be cheaper with your insurance), but the website can give you the price if you did not use any insurance.  ?- You can print the associated coupon and take it with your prescription to the pharmacy.  ?- You may also stop by our office during regular business hours and pick up a GoodRx coupon card.  ?- If you need your prescription sent electronically to a different pharmacy, notify our office through [facility] or by phone at [phone] option 4. ? ? ? ? ?Si Usted Necesita Algo Despu?s de Su Visita ? ?Tambi?n puede enviarnos un mensaje a trav?s de MyChart. Por lo general respondemos a los mensajes de MyChart en el transcurso de 1 a 2 d?as h?biles. ? ?Para renovar recetas, por favor pida a su farmacia que se ponga en contacto con nuestra oficina. Nuestro n?mero de fax  es el [phone]. ? ?Si tiene un asunto urgente cuando la cl?nica est? cerrada y que no puede esperar hasta el siguiente d?a h?bil, puede llamar/localizar a su doctor(a) al n?mero que aparece a continuaci?n.  ? ?Por favor, tenga en cuenta que aunque hacemos todo lo posible para estar disponibles para asuntos urgentes fuera del horario de oficina, no estamos disponibles las 24 horas del d?a, los 7 d?as de la semana.  ? ?Si tiene un problema urgente y no puede comunicarse con nosotros, puede optar por buscar atenci?n  m?dica  en el consultorio de su doctor(a), en una cl?nica privada, en un centro de atenci?n urgente o en una sala de emergencias. ? ?Si tiene Engineer, maintenance (IT) m?dica, por favor llame inmediatamente al 911 o vaya a la sala de emergencias. ? ?N?meros de b?per ? ?- Dr. Nehemiah Massed: [phone] ? ?- Dra. Moye: [phone] ? ?- Dra. Nicole Kindred: [phone] ? ?En caso de inclemencias del tiempo, por favor llame a nuestra l?nea principal al [phone] para una actualizaci?n sobre el estado de cualquier retraso o cierre. ? ?Consejos para la medicaci?n en dermatolog?a: ?Por favor, guarde las cajas en las que vienen los medicamentos de uso t?pico para ayudarle a seguir las instrucciones sobre d?nde y c?mo usarlos. Las farmacias generalmente imprimen las instrucciones del medicamento s?lo en las cajas y no directamente en los tubos del [place].  ? ?Si su medicamento es muy caro, por favor, p?ngase en contacto con Zigmund Daniel llamando al [phone] y presione la opci?n 4 o env?enos un mensaje a trav?s de MyChart.  ? ?No podemos decirle cu?l ser? su copago por los medicamentos por adelantado ya que esto es diferente dependiendo de la cobertura de su seguro. Sin embargo, es posible que podamos encontrar un medicamento sustituto a Electrical engineer un formulario para que el seguro cubra el medicamento que se considera necesario.  ? ?Si se requiere Ardelia Mems autorizaci?n previa para que su compa??a de seguros [place] su medicamento, por favor perm?tanos de 1 a 2 d?as h?biles para completar este proceso. ? ?Los precios de los medicamentos var?an con frecuencia dependiendo del Environmental consultant de d?nde se surte la receta y alguna farmacias pueden ofrecer precios m?s baratos. ? ?El sitio web www.goodrx.com tiene cupones para medicamentos de Airline pilot. Los precios aqu? no tienen en cuenta lo que podr?a costar con la ayuda del seguro (puede ser m?s barato con su seguro), pero el sitio web puede darle el precio si no utiliz? ning?n  seguro.  ?- Puede imprimir el cup?n correspondiente y llevarlo con su receta a la farmacia.  ?- Tambi?n puede pasar por nuestra oficina durante el horario de atenci?n regular y recoger una tarjeta de cupones de GoodRx.  ?- Si necesita que su receta se env?e electr?nicamente a Chiropodist, informe a nuestra oficina a trav?s de MyChart de  o por tel?fono llamando al [phone] y presione la opci?n 4.  ?

## 2022-01-24 ENCOUNTER — Ambulatory Visit (INDEPENDENT_AMBULATORY_CARE_PROVIDER_SITE_OTHER): Payer: 59 | Admitting: Family Medicine

## 2022-01-24 ENCOUNTER — Encounter: Payer: Self-pay | Admitting: Family Medicine

## 2022-01-24 VITALS — BP 124/80 | HR 67 | Temp 97.4°F | Ht 72.0 in | Wt 295.1 lb

## 2022-01-24 DIAGNOSIS — K219 Gastro-esophageal reflux disease without esophagitis: Secondary | ICD-10-CM

## 2022-01-24 DIAGNOSIS — I10 Essential (primary) hypertension: Secondary | ICD-10-CM

## 2022-01-24 DIAGNOSIS — F419 Anxiety disorder, unspecified: Secondary | ICD-10-CM

## 2022-01-24 DIAGNOSIS — Z1211 Encounter for screening for malignant neoplasm of colon: Secondary | ICD-10-CM

## 2022-01-24 DIAGNOSIS — Z0000 Encounter for general adult medical examination without abnormal findings: Secondary | ICD-10-CM | POA: Diagnosis not present

## 2022-01-24 DIAGNOSIS — E785 Hyperlipidemia, unspecified: Secondary | ICD-10-CM

## 2022-01-24 DIAGNOSIS — G4733 Obstructive sleep apnea (adult) (pediatric): Secondary | ICD-10-CM

## 2022-01-24 DIAGNOSIS — R9431 Abnormal electrocardiogram [ECG] [EKG]: Secondary | ICD-10-CM

## 2022-01-24 MED ORDER — AMLODIPINE BESYLATE 5 MG PO TABS: 5.0000 mg | ORAL_TABLET | Freq: Every day | ORAL | 3 refills | Status: DC

## 2022-01-24 NOTE — Assessment & Plan Note (Signed)
Encouraged healthy diet and lifestyle choices to affect sustainable weight loss.  ?

## 2022-01-24 NOTE — Assessment & Plan Note (Addendum)
PRN PPI He does not need refill at this time.

## 2022-01-24 NOTE — Assessment & Plan Note (Addendum)
Chronic, off meds. Improvement noted in the past year. Reviewed ASCVD risk score with pt. He does have fmhx CAD/MI.  He will remain off medication at this time.  Discussed small amount of statin is better than on at all.  The 10-year ASCVD risk score (Arnett DK, et al., 2019) is: 9.6%   Values used to calculate the score:     Age: 61 years     Sex: Male     Is Non-Hispanic African American: No     Diabetic: No     Tobacco smoker: No     Systolic Blood Pressure: 438 mmHg     Is BP treated: Yes     HDL Cholesterol: 42.1 mg/dL     Total Cholesterol: 177 mg/dL

## 2022-01-24 NOTE — Assessment & Plan Note (Signed)
Status post reassuring cardiac evaluation June 2022

## 2022-01-24 NOTE — Assessment & Plan Note (Signed)
Chronic, not on CPAP.  Denies significant daytime somnolence

## 2022-01-24 NOTE — Assessment & Plan Note (Signed)
Chronic, stable on current regimen.  

## 2022-01-24 NOTE — Progress Notes (Signed)
[name], male    DOB: 1961/03/05, 61 y.o.   MRN: [id number]  This visit was conducted in person.  BP 124/80   Pulse 67   Temp (!) 97.4 F (36.3 C) (Temporal)   Ht 6' (1.829 m)   Wt 295 lb 2 oz (133.9 kg)   SpO2 97%   BMI 40.03 kg/m    CC: CPE Subjective:   HPI: Phillip Miles is a 61 y.o. male presenting on 01/24/2022 for Annual Exam   OSA - not on CPAP. Sleep study in chart 2017. Previously improved with weight loss. Trouble shutting the mind off at night time. No PNDyspnea. No witnessed apnea.    EKG with possible old septal infarct, chronic. Saw cardiology 02/2021 - given normal echo 2018 and pt asxs, no further intervention recommended. To f/u if pt became symptomatic. He stopped aspirin - forgot to buy.   He's started doing intermittent fasting.  He started CBD oil - feels this helps social anxiety. Also started noticing some ?claustrophobia (ie long car tunnels).    Preventative: COLONOSCOPY 01/2012; HP, rpt 10 yrs Carlean Purl) - discussed options, would like to start with iFOB.  Prostate screening - yearly given family history (father)  Lung cancer screening - not eligible  Flu shot yearly  [place] 09/2020, 10/2020  Td 2006, Tdap 2019 Shingrix - 01/2021, 04/2021  Seat belt use discussed  Sunscreen use discussed. Wears sun shirts. No changing moles on skin. saw skin doctor.  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 Interim medical history since our last visit reviewed. Allergies and medications reviewed and updated. Outpatient Medications Prior to Visit   Medication Sig Dispense Refill   acetaminophen (TYLENOL) 325 MG tablet Take 650 mg by mouth every 6 (six) hours as needed for pain.      Cholecalciferol (VITAMIN D3 PO) Take by mouth daily.     ibuprofen (ADVIL,MOTRIN) 200 MG tablet Take 200 mg every 6 (six) hours as needed by mouth.     loratadine (CLARITIN) 10 MG tablet Take 10 mg by mouth daily as needed for allergies.     Magnesium 500 MG CAPS Take 1 capsule by mouth daily.     meloxicam (MOBIC) 7.5 MG tablet Take 7.5 mg by mouth daily.     Misc Natural Products (GLUCOSAMINE-CHONDROITIN SULF PO) Take 1 capsule by mouth.     Multiple Vitamin (MULTIVITAMIN) tablet Take 1 tablet by mouth daily.     mupirocin ointment (BACTROBAN) 2 % Apply 1 application topically daily. Qd to cyst on back 22 g 1   Turmeric 500 MG CAPS Take 1 tablet by mouth 3 (three) times daily as needed.     amLODipine (NORVASC) 5 MG tablet Take 1 tablet (5 mg total) by mouth daily. 90 tablet 3   omeprazole (PRILOSEC) 40 MG capsule TAKE 1 CAPSULE (40 MG TOTAL) BY MOUTH DAILY AS NEEDED (GERD SYMPTOMS). 90 capsule 3   omeprazole (PRILOSEC) 40 MG capsule Take 1 capsule (40 mg total) by mouth daily as needed (GERD symptoms).     aspirin  EC 81 MG tablet Take 81 mg by mouth daily.     No facility-administered medications prior to visit.     Per HPI unless specifically indicated in ROS section below Review of Systems  Constitutional:  Negative for activity change, appetite change, chills, fatigue, fever and unexpected weight change.  HENT:  Negative for hearing loss.   Eyes:  Negative for visual disturbance.  Respiratory:  Negative for cough, chest tightness, shortness of breath and wheezing.   Cardiovascular:  Negative for chest pain, palpitations and leg swelling.  Gastrointestinal:  Positive for constipation (occ). Negative for abdominal distention, abdominal pain, blood in stool, diarrhea, nausea and vomiting.  Genitourinary:  Negative for difficulty urinating and  hematuria.  Musculoskeletal:  Negative for arthralgias, myalgias and neck pain.  Skin:  Negative for rash.  Neurological:  Negative for dizziness, seizures, syncope and headaches.  Hematological:  Negative for adenopathy. Does not bruise/bleed easily.  Psychiatric/Behavioral:  Negative for dysphoric mood. The patient is nervous/anxious.    Objective:  BP 124/80   Pulse 67   Temp (!) 97.4 F (36.3 C) (Temporal)   Ht 6' (1.829 m)   Wt 295 lb 2 oz (133.9 kg)   SpO2 97%   BMI 40.03 kg/m   Wt Readings from Last 3 Encounters:  01/24/22 295 lb 2 oz (133.9 kg)  02/22/21 298 lb (135.2 kg)  01/18/21 295 lb 4 oz (133.9 kg)      Physical Exam Vitals and nursing note reviewed.  Constitutional:      General: He is not in acute distress.    Appearance: Normal appearance. He is well-developed. He is not ill-appearing.  HENT:     Head: Normocephalic and atraumatic.     Right Ear: Hearing, tympanic membrane, ear canal and external ear normal.     Left Ear: Hearing, tympanic membrane, ear canal and external ear normal.  Eyes:     General: No scleral icterus.    Extraocular Movements: Extraocular movements intact.     Conjunctiva/sclera: Conjunctivae normal.     Pupils: Pupils are equal, round, and reactive to light.  Neck:     Thyroid: No thyroid mass or thyromegaly.  Cardiovascular:     Rate and Rhythm: Normal rate and regular rhythm.     Pulses: Normal pulses.          Radial pulses are 2+ on the right side and 2+ on the left side.     Heart sounds: Normal heart sounds. No murmur heard. Pulmonary:     Effort: Pulmonary effort is normal. No respiratory distress.     Breath sounds: Normal breath sounds. No wheezing, rhonchi or rales.  Abdominal:     General: Bowel sounds are normal. There is no distension.     Palpations: Abdomen is soft. There is no mass.     Tenderness: There is no abdominal tenderness. There is no guarding or rebound.     Hernia: No hernia is present.   Musculoskeletal:        General: Normal range of motion.     Cervical back: Normal range of motion and neck supple.     Right lower leg: No edema.     Left lower leg: No edema.  Lymphadenopathy:     Cervical: No cervical adenopathy.  Skin:    General: Skin is warm and dry.     Findings: No rash.  Neurological:     General: No focal deficit present.     Mental Status: He is  alert and oriented to person, place, and time.  Psychiatric:        Mood and Affect: Mood normal.        Behavior: Behavior normal.        Thought Content: Thought content normal.        Judgment: Judgment normal.      Results for orders placed or performed in visit on 01/17/22  Microalbumin / creatinine urine ratio  Result Value Ref Range   Microalb, Ur <0.7 0.0 - 1.9 mg/dL   Creatinine,U 57.7 mg/dL   Microalb Creat Ratio 1.2 0.0 - 30.0 mg/g  PSA  Result Value Ref Range   PSA 0.96 0.10 - 4.00 ng/mL  Comprehensive metabolic panel  Result Value Ref Range   Sodium 139 135 - 145 mEq/L   Potassium 4.5 3.5 - 5.1 mEq/L   Chloride 104 96 - 112 mEq/L   CO2 29 19 - 32 mEq/L   Glucose, Bld 98 70 - 99 mg/dL   BUN 27 (H) 6 - 23 mg/dL   Creatinine, Ser 1.19 0.40 - 1.50 mg/dL   Total Bilirubin 0.4 0.2 - 1.2 mg/dL   Alkaline Phosphatase 96 39 - 117 U/L   AST 16 0 - 37 U/L   ALT 21 0 - 53 U/L   Total Protein 6.6 6.0 - 8.3 g/dL   Albumin 4.0 3.5 - 5.2 g/dL   GFR 66.38 >60.00 mL/min   Calcium 9.2 8.4 - 10.5 mg/dL  Lipid panel  Result Value Ref Range   Cholesterol 177 0 - 200 mg/dL   Triglycerides 91.0 0.0 - 149.0 mg/dL   HDL 42.10 >39.00 mg/dL   VLDL 18.2 0.0 - 40.0 mg/dL   LDL Cholesterol 117 (H) 0 - 99 mg/dL   Total CHOL/HDL Ratio 4    NonHDL 134.93       01/24/2022   11:00 AM 01/18/2021   11:29 AM 12/16/2019   10:02 AM 07/30/2018    2:18 PM 07/24/2017    8:27 AM  Depression screen PHQ 2/9  Decreased Interest 1 1 0 0 2  Down, Depressed, Hopeless '1 1 1 '$ 0 1  PHQ - 2 Score '2 2 1 '$ 0 3  Altered sleeping '2  1 1  '$ 0  Tired, decreased energy '2 1 2  2  '$ Change in appetite '2 2 2  '$ 0  Feeling bad or failure about yourself  '1 1 1  1  '$ Trouble concentrating 0 0 1  3  Moving slowly or fidgety/restless 0 0 0  0  Suicidal thoughts 0 0 0  1  PHQ-9 Score '9 7 8  10  '$ Difficult doing work/chores Somewhat difficult           01/24/2022   11:00 AM 01/18/2021   11:30 AM 12/16/2019   10:02 AM  GAD 7 : Generalized Anxiety Score  Nervous, Anxious, on Edge '2 1 2  '$ Control/stop worrying '3 1 1  '$ Worry too much - different things '2 2 2  '$ Trouble relaxing '2 1 2  '$ Restless 2 0 1  Easily annoyed or irritable '3 2 2  '$ Afraid - awful might happen 1 0 1  Total GAD 7 Score '15 7 11  '$ Anxiety Difficulty Somewhat difficult     Assessment & Plan:   Problem List Items Addressed This Visit     Health maintenance examination - Primary (Chronic)    Preventative protocols reviewed and updated unless pt declined. Discussed healthy diet and lifestyle.  GERD (gastroesophageal reflux disease)    PRN PPI He does not need refill at this time.       Relevant Medications   omeprazole (PRILOSEC) 40 MG capsule   Essential hypertension    Chronic, stable on current regimen.        Relevant Medications   amLODipine (NORVASC) 5 MG tablet   Obesity, morbid, BMI 40.0-49.9 ([place])    Encouraged healthy diet and lifestyle choices to affect sustainable weight loss.       OSA (obstructive sleep apnea)    Chronic, not on CPAP.  Denies significant daytime somnolence       Nonspecific abnormal electrocardiogram (ECG) (EKG)    Status post reassuring cardiac evaluation June 2022       Dyslipidemia    Chronic, off meds. Improvement noted in the past year. Reviewed ASCVD risk score with pt. He does have fmhx CAD/MI.  He will remain off medication at this time.  Discussed small amount of statin is better than on at all.  The 10-year ASCVD risk score (Arnett DK, et al., 2019) is: 9.6%   Values used to calculate the score:      Age: 61 years     Sex: Male     Is Non-Hispanic African American: No     Diabetic: No     Tobacco smoker: No     Systolic Blood Pressure: 785 mmHg     Is BP treated: Yes     HDL Cholesterol: 42.1 mg/dL     Total Cholesterol: 177 mg/dL        Anxiety    Predominant social anxiety with large gatherings, stable period off medication. Notes CBD is helpful. Discussed healthy stress relieving strategies.        Other Visit Diagnoses     Special screening for malignant neoplasms, colon       Relevant Orders   Ambulatory referral to Gastroenterology        Meds ordered this encounter  Medications   amLODipine (NORVASC) 5 MG tablet    Sig: Take 1 tablet (5 mg total) by mouth daily.    Dispense:  90 tablet    Refill:  3   Orders Placed This Encounter  Procedures   Ambulatory referral to Gastroenterology    Referral Priority:   Routine    Referral Type:   Consultation    Referral Reason:   Specialty Services Required    Number of Visits Requested:   1    Patient instructions: We will refer you back for colonoscopy.  You may call  GI to schedule an appointment at [phone].   Continue working on [employer] and lifestyle.  Return as needed or in 1 year for next physical.   Follow up plan: Return in about 1 year (around 01/25/2023), or if symptoms worsen or fail to improve, for annual exam, prior fasting for blood work.  Ria Bush, MD

## 2022-01-24 NOTE — Assessment & Plan Note (Signed)
Preventative protocols reviewed and updated unless pt declined. Discussed healthy diet and lifestyle.  

## 2022-01-24 NOTE — Assessment & Plan Note (Signed)
Predominant social anxiety with large gatherings, stable period off medication. Notes CBD is helpful. Discussed healthy stress relieving strategies.

## 2022-01-24 NOTE — Patient Instructions (Addendum)
We will refer you back for colonoscopy.  You may call [facility] to schedule an appointment at [phone].   Continue working on [employer] and lifestyle.  Return as needed or in 1 year for next physical.       Mediterranean Diet  Why follow it? Research shows. Those who follow the Mediterranean diet have a reduced risk of heart disease  The diet is associated with a reduced incidence of Parkinson's and Alzheimer's diseases People following the diet may have longer life expectancies and lower rates of chronic diseases  The Dietary Guidelines for Americans recommends the Mediterranean diet as an eating plan to promote health and prevent disease  What Is the Mediterranean Diet?  Healthy eating plan based on typical foods and recipes of Mediterranean-style cooking The diet is primarily a plant based diet; these foods should make up a majority of meals   Starches - Plant based foods should make up a majority of meals - They are an important sources of vitamins, minerals, energy, antioxidants, and fiber - Choose whole grains, foods high in fiber and minimally processed items  - Typical grain sources include wheat, oats, barley, corn, brown rice, bulgar, farro, millet, polenta, couscous  - Various types of beans include chickpeas, lentils, fava beans, black beans, white beans   Fruits  Veggies - Large quantities of antioxidant rich fruits & veggies; 6 or more servings  - Vegetables can be eaten raw or lightly drizzled with oil and cooked  - Vegetables common to the traditional Mediterranean Diet include: artichokes, arugula, beets, broccoli, brussel sprouts, cabbage, carrots, celery, collard greens, cucumbers, eggplant, kale, leeks, lemons, lettuce, mushrooms, okra, onions, peas, peppers, potatoes, pumpkin, radishes, rutabaga, shallots, spinach, sweet potatoes, turnips, zucchini - Fruits common to the Mediterranean Diet include: apples, apricots, avocados, cherries, clementines, dates, figs,  grapefruits, grapes, melons, nectarines, oranges, peaches, pears, pomegranates, strawberries, tangerines  Fats - Replace butter and margarine with healthy oils, such as olive oil, canola oil, and tahini  - Limit nuts to no more than a handful a day  - Nuts include walnuts, almonds, pecans, pistachios, pine nuts  - Limit or avoid candied, honey roasted or heavily salted nuts - Olives are central to the [employer] - can be eaten whole or used in a variety of dishes   Meats Protein - Limiting red meat: no more than a few times a month - When eating red meat: choose lean cuts and keep the portion to the size of deck of cards - Eggs: approx. 0 to 4 times a week  - Fish and lean poultry: at least 2 a week  - Healthy protein sources include, chicken, [place], lean beef, lamb - Increase intake of seafood such as tuna, salmon, trout, mackerel, shrimp, scallops - Avoid or limit high fat processed meats such as sausage and bacon  Dairy - Include moderate amounts of low fat dairy products  - Focus on healthy dairy such as fat free yogurt, skim milk, low or reduced fat cheese - Limit dairy products higher in fat such as whole or 2% milk, cheese, ice cream  Alcohol - Moderate amounts of red wine is ok  - No more than 5 oz daily for women (all ages) and men older than age 103  - No more than 10 oz of wine daily for men younger than 57  Other - Limit sweets and other desserts  - Use herbs and spices instead of salt to flavor foods  - Herbs and spices  common to the traditional Mediterranean Diet include: basil, bay leaves, chives, cloves, cumin, fennel, garlic, lavender, marjoram, mint, oregano, parsley, pepper, rosemary, sage, savory, sumac, tarragon, thyme   It's not just a diet, it's a lifestyle:  The Mediterranean diet includes lifestyle factors typical of those in the region  Foods, drinks and meals are best eaten with others and savored Daily physical activity is important for overall good  health This could be strenuous exercise like running and aerobics This could also be more leisurely activities such as walking, housework, yard-work, or taking the stairs Moderation is the key; a balanced and healthy diet accommodates most foods and drinks Consider portion sizes and frequency of consumption of certain foods   Meal Ideas & Options:  Breakfast:  Whole wheat toast or whole wheat English muffins with peanut butter & hard boiled egg Steel cut oats topped with apples & cinnamon and skim milk  Fresh fruit: banana, strawberries, melon, berries, peaches  Smoothies: strawberries, bananas, greek yogurt, peanut butter Low fat greek yogurt with blueberries and granola  Egg white omelet with spinach and mushrooms Breakfast couscous: whole wheat couscous, apricots, skim milk, cranberries  Sandwiches:  Hummus and grilled vegetables (peppers, zucchini, squash) on whole wheat bread   Grilled chicken on whole wheat pita with lettuce, tomatoes, cucumbers or tzatziki  Jordan salad on whole wheat bread: tuna salad made with greek yogurt, olives, red peppers, capers, green onions Garlic rosemary lamb pita: lamb sauted with garlic, rosemary, salt & pepper; add lettuce, cucumber, greek yogurt to pita - flavor with lemon juice and black pepper  Seafood:  Mediterranean grilled salmon, seasoned with garlic, basil, parsley, lemon juice and black pepper Shrimp, lemon, and spinach whole-grain pasta salad made with low fat greek yogurt  Seared scallops with lemon orzo  Seared tuna steaks seasoned salt, pepper, coriander topped with tomato mixture of olives, tomatoes, olive oil, minced garlic, parsley, green onions and cappers  Meats:  Herbed greek chicken salad with kalamata olives, cucumber, feta  Red bell peppers stuffed with spinach, bulgur, lean ground beef (or lentils) & topped with feta   Kebabs: skewers of chicken, tomatoes, onions, zucchini, squash  [place] burgers: made with red onions, mint,  dill, lemon juice, feta cheese topped with roasted red peppers Vegetarian Cucumber salad: cucumbers, artichoke hearts, celery, red onion, feta cheese, tossed in olive oil & lemon juice  Hummus and whole grain pita points with a greek salad (lettuce, tomato, feta, olives, cucumbers, red onion) Lentil soup with celery, carrots made with vegetable broth, garlic, salt and pepper  Tabouli salad: parsley, bulgur, mint, scallions, cucumbers, tomato, radishes, lemon juice, olive oil, salt and pepper.

## 2022-02-01 ENCOUNTER — Encounter: Payer: Self-pay | Admitting: Dermatology

## 2022-03-17 ENCOUNTER — Encounter: Payer: Self-pay | Admitting: Internal Medicine

## 2023-01-21 ENCOUNTER — Other Ambulatory Visit: Payer: Self-pay | Admitting: Family Medicine

## 2023-01-21 DIAGNOSIS — I10 Essential (primary) hypertension: Secondary | ICD-10-CM

## 2023-01-21 DIAGNOSIS — Z125 Encounter for screening for malignant neoplasm of prostate: Secondary | ICD-10-CM

## 2023-01-21 DIAGNOSIS — E785 Hyperlipidemia, unspecified: Secondary | ICD-10-CM

## 2023-01-23 ENCOUNTER — Other Ambulatory Visit (INDEPENDENT_AMBULATORY_CARE_PROVIDER_SITE_OTHER): Payer: 59

## 2023-01-23 DIAGNOSIS — E785 Hyperlipidemia, unspecified: Secondary | ICD-10-CM | POA: Diagnosis not present

## 2023-01-23 DIAGNOSIS — Z125 Encounter for screening for malignant neoplasm of prostate: Secondary | ICD-10-CM | POA: Diagnosis not present

## 2023-01-23 DIAGNOSIS — I10 Essential (primary) hypertension: Secondary | ICD-10-CM

## 2023-01-23 LAB — COMPREHENSIVE METABOLIC PANEL
ALT: 20 U/L (ref 0–53)
AST: 16 U/L (ref 0–37)
Albumin: 3.9 g/dL (ref 3.5–5.2)
Alkaline Phosphatase: 80 U/L (ref 39–117)
BUN: 22 mg/dL (ref 6–23)
CO2: 29 mEq/L (ref 19–32)
Calcium: 8.8 mg/dL (ref 8.4–10.5)
Chloride: 104 mEq/L (ref 96–112)
Creatinine, Ser: 1.23 mg/dL (ref 0.40–1.50)
GFR: 63.34 mL/min (ref >60.00)
Glucose, Bld: 101 mg/dL — ABNORMAL HIGH (ref 70–99)
Potassium: 4.2 mEq/L (ref 3.5–5.1)
Sodium: 139 mEq/L (ref 135–145)
Total Bilirubin: 0.4 mg/dL (ref 0.2–1.2)
Total Protein: 6.6 g/dL (ref 6.0–8.3)

## 2023-01-23 LAB — LIPID PANEL
Cholesterol: 159 mg/dL (ref 0–200)
HDL: 34.3 mg/dL — ABNORMAL LOW (ref >39.00)
LDL Cholesterol: 105 mg/dL — ABNORMAL HIGH (ref 0–99)
NonHDL: 124.6
Total CHOL/HDL Ratio: 5
Triglycerides: 98 mg/dL (ref 0.0–149.0)
VLDL: 19.6 mg/dL (ref 0.0–40.0)

## 2023-01-23 LAB — MICROALBUMIN / CREATININE URINE RATIO
Creatinine,U: 87.3 mg/dL
Microalb Creat Ratio: 0.8 mg/g (ref 0.0–30.0)
Microalb, Ur: 0.7 mg/dL (ref 0.0–1.9)

## 2023-01-23 LAB — PSA: PSA: 0.72 ng/mL (ref 0.10–4.00)

## 2023-01-27 ENCOUNTER — Other Ambulatory Visit: Payer: Self-pay | Admitting: Family Medicine

## 2023-01-27 DIAGNOSIS — I10 Essential (primary) hypertension: Secondary | ICD-10-CM

## 2023-01-30 ENCOUNTER — Encounter: Payer: Self-pay | Admitting: Family Medicine

## 2023-01-30 ENCOUNTER — Ambulatory Visit (INDEPENDENT_AMBULATORY_CARE_PROVIDER_SITE_OTHER): Payer: 59 | Admitting: Family Medicine

## 2023-01-30 VITALS — BP 134/70 | HR 71 | Temp 97.2°F | Ht 71.75 in | Wt 301.2 lb

## 2023-01-30 DIAGNOSIS — Z1211 Encounter for screening for malignant neoplasm of colon: Secondary | ICD-10-CM

## 2023-01-30 DIAGNOSIS — R600 Localized edema: Secondary | ICD-10-CM

## 2023-01-30 DIAGNOSIS — K219 Gastro-esophageal reflux disease without esophagitis: Secondary | ICD-10-CM

## 2023-01-30 DIAGNOSIS — E785 Hyperlipidemia, unspecified: Secondary | ICD-10-CM

## 2023-01-30 DIAGNOSIS — R9431 Abnormal electrocardiogram [ECG] [EKG]: Secondary | ICD-10-CM

## 2023-01-30 DIAGNOSIS — Z0000 Encounter for general adult medical examination without abnormal findings: Secondary | ICD-10-CM

## 2023-01-30 DIAGNOSIS — I10 Essential (primary) hypertension: Secondary | ICD-10-CM

## 2023-01-30 MED ORDER — ACETAMINOPHEN ER 650 MG PO TBCR: 1300.0000 mg | EXTENDED_RELEASE_TABLET | Freq: Every day | ORAL | Status: AC | PRN

## 2023-01-30 MED ORDER — AMLODIPINE BESYLATE 5 MG PO TABS: 5.0000 mg | ORAL_TABLET | Freq: Every day | ORAL | 4 refills | Status: DC

## 2023-01-30 MED ORDER — VITAMIN D3 25 MCG (1000 UT) PO CAPS
1.0000 | ORAL_CAPSULE | Freq: Every day | ORAL | Status: DC
Start: 2023-01-30 — End: 2023-12-18

## 2023-01-30 NOTE — Assessment & Plan Note (Addendum)
Chronic off meds. He does have fmhx CAD. Check Lp(a) next labwork.  Will also refer for coronary CT with calcium score.  The 10-year ASCVD risk score (Arnett DK, et al., 2019) is: 12.5%   Values used to calculate the score:     Age: 62 years     Sex: Male     Is Non-Hispanic African American: No     Diabetic: No     Tobacco smoker: No     Systolic Blood Pressure: 134 mmHg     Is BP treated: Yes     HDL Cholesterol: 34.3 mg/dL     Total Cholesterol: 159 mg/dL

## 2023-01-30 NOTE — Addendum Note (Signed)
Addended by: Eustaquio Boyden on: 01/30/2023 08:37 AM   Modules accepted: Orders

## 2023-01-30 NOTE — Assessment & Plan Note (Signed)
Preventative protocols reviewed and updated unless pt declined. Discussed healthy diet and lifestyle.  

## 2023-01-30 NOTE — Progress Notes (Addendum)
Ph: [phone] Fax: [phone]   [name], male    DOB: December 15, 1960, 62 y.o.   MRN: [id number]  This visit was conducted in person.  BP 134/70   Pulse 71   Temp (!) 97.2 F (36.2 C) (Temporal)   Ht 5' 11.75" (1.822 m)   Wt (!) 301 lb 4 oz (136.6 kg)   SpO2 97%   BMI 41.14 kg/m    CC: CPE Subjective:   HPI: Phillip Miles is a 62 y.o. male presenting on 01/30/2023 for Annual Exam   93yo mother just passed away.   Having left knee and bilateral hip pain - saw ortho s/p knee aspiration and cortisone injection. This causes him to wake up at night a lot.   H/o OSA - not on CPAP. Sleep study in chart 2017. Previously improved with weight loss. Loud snorer. Some daytime somnolence.. No witnessed apnea or PNdyspnea.   EKG with possible chronic old septal infarct. Saw cardiology 02/2021 - given normal echo 2018 and pt asxs, no further intervention recommended. To f/u if pt became symptomatic. Interested in coronary CT [facility].   He's started doing intermittent fasting.  He started CBD oil - feels this helps social anxiety. Also started noticing some ?claustrophobia (ie long car tunnels).   Preventative: COLONOSCOPY 01/2012; HP, rpt 10 yrs Leone Payor) - agrees to iFOB.  Prostate screening - yearly given family history (father)  Lung cancer screening - not eligible  Flu shot yearly  COVID vaccine Pfizer 09/2020, 10/2020  Td 2006, Tdap 2019 RSV - discussed  Shingrix - 01/2021, 04/2021  Seat belt use discussed  Sunscreen use discussed. No changing moles on skin. Sees derm yearly.  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 Interim medical history since our last visit reviewed. Allergies and medications reviewed and updated. Outpatient Medications Prior to Visit  Medication Sig Dispense Refill   ibuprofen (ADVIL,MOTRIN) 200 MG tablet Take 200 mg every 6 (six) hours as needed by mouth.     loratadine (CLARITIN) 10 MG tablet Take 10 mg by mouth daily as needed for allergies.     Magnesium 500 MG CAPS Take 1 capsule by mouth daily.     Misc Natural Products (GLUCOSAMINE-CHONDROITIN SULF PO) Take 1 capsule by mouth.     Multiple Vitamin (MULTIVITAMIN) tablet Take 1 tablet by mouth daily.     mupirocin ointment (BACTROBAN) 2 % Apply 1 application topically daily. Qd to cyst on back 22 g 1   acetaminophen (TYLENOL) 325 MG tablet Take 650 mg by mouth every 6 (six) hours as needed for pain.      amLODipine (NORVASC) 5 MG tablet TAKE 1 TABLET (5 MG TOTAL) BY MOUTH DAILY. 90 tablet 0   Cholecalciferol (VITAMIN D3 PO) Take by mouth daily.     meloxicam (MOBIC) 7.5 MG tablet Take 7.5 mg by mouth daily.     omeprazole (PRILOSEC) 40 MG capsule Take 1 capsule (40 mg total) by mouth daily as needed (GERD symptoms).     Turmeric 500 MG CAPS Take 1  tablet by mouth 3 (three) times daily as needed.     omeprazole (PRILOSEC) 40 MG capsule Take 1 capsule (40 mg total) by mouth daily as needed (GERD symptoms).     No facility-administered medications prior to visit.     Per HPI unless specifically indicated in ROS section below Review of Systems  Constitutional:  Negative for activity change, appetite change, chills, fatigue, fever and unexpected weight change.  HENT:  Negative for hearing loss.   Eyes:  Negative for visual disturbance.  Respiratory:  Negative for cough, chest tightness, shortness of breath and wheezing.   Cardiovascular:  Positive for chest pain (pressure when overeating), palpitations and leg swelling.  Gastrointestinal:  Positive for constipation  (mild). Negative for abdominal distention, abdominal pain, blood in stool, diarrhea, nausea and vomiting.  Genitourinary:  Negative for difficulty urinating and hematuria.  Musculoskeletal:  Negative for arthralgias, myalgias and neck pain.  Skin:  Negative for rash.  Neurological:  Positive for headaches (occ sinus pressure headache). Negative for dizziness, seizures and syncope.  Hematological:  Negative for adenopathy. Does not bruise/bleed easily.  Psychiatric/Behavioral:  Negative for dysphoric mood. The patient is not nervous/anxious.     Objective:  BP 134/70   Pulse 71   Temp (!) 97.2 F (36.2 C) (Temporal)   Ht 5' 11.75" (1.822 m)   Wt (!) 301 lb 4 oz (136.6 kg)   SpO2 97%   BMI 41.14 kg/m   Wt Readings from Last 3 Encounters:  01/30/23 (!) 301 lb 4 oz (136.6 kg)  01/24/22 295 lb 2 oz (133.9 kg)  02/22/21 298 lb (135.2 kg)      Physical Exam Vitals and nursing note reviewed.  Constitutional:      General: He is not in acute distress.    Appearance: Normal appearance. He is well-developed. He is not ill-appearing.  HENT:     Head: Normocephalic and atraumatic.     Right Ear: Hearing, tympanic membrane, ear canal and external ear normal.     Left Ear: Hearing, tympanic membrane, ear canal and external ear normal.     Nose: Nose normal.     Mouth/Throat:     Mouth: Mucous membranes are moist.     Pharynx: Oropharynx is clear. No oropharyngeal exudate or posterior oropharyngeal erythema.  Eyes:     General: No scleral icterus.    Extraocular Movements: Extraocular movements intact.     Conjunctiva/sclera: Conjunctivae normal.     Pupils: Pupils are equal, round, and reactive to light.  Neck:     Thyroid: No thyroid mass or thyromegaly.  Cardiovascular:     Rate and Rhythm: Normal rate and regular rhythm.     Pulses: Normal pulses.          Radial pulses are 2+ on the right side and 2+ on the left side.     Heart sounds: Normal heart sounds. No murmur  heard. Pulmonary:     Effort: Pulmonary effort is normal. No respiratory distress.     Breath sounds: Normal breath sounds. No wheezing, rhonchi or rales.  Abdominal:     General: Bowel sounds are normal. There is no distension.     Palpations: Abdomen is soft. There is no mass.     Tenderness: There is no abdominal tenderness. There is no guarding or rebound.     Hernia: No hernia is present.  Musculoskeletal:        General: Normal range of motion.     Cervical back: Normal  range of motion and neck supple.     Right lower leg: No edema.     Left lower leg: No edema.  Lymphadenopathy:     Cervical: No cervical adenopathy.  Skin:    General: Skin is warm and dry.     Findings: No rash.  Neurological:     General: No focal deficit present.     Mental Status: He is alert and oriented to person, place, and time.  Psychiatric:        Mood and Affect: Mood normal.        Behavior: Behavior normal.        Thought Content: Thought content normal.        Judgment: Judgment normal.       Results for orders placed or performed in visit on 01/23/23  Microalbumin / creatinine urine ratio  Result Value Ref Range   Microalb, Ur 0.7 0.0 - 1.9 mg/dL   Creatinine,U 29.5 mg/dL   Microalb Creat Ratio 0.8 0.0 - 30.0 mg/g  PSA  Result Value Ref Range   PSA 0.72 0.10 - 4.00 ng/mL  Comprehensive metabolic panel  Result Value Ref Range   Sodium 139 135 - 145 mEq/L   Potassium 4.2 3.5 - 5.1 mEq/L   Chloride 104 96 - 112 mEq/L   CO2 29 19 - 32 mEq/L   Glucose, Bld 101 (H) 70 - 99 mg/dL   BUN 22 6 - 23 mg/dL   Creatinine, Ser 6.21 0.40 - 1.50 mg/dL   Total Bilirubin 0.4 0.2 - 1.2 mg/dL   Alkaline Phosphatase 80 39 - 117 U/L   AST 16 0 - 37 U/L   ALT 20 0 - 53 U/L   Total Protein 6.6 6.0 - 8.3 g/dL   Albumin 3.9 3.5 - 5.2 g/dL   GFR 30.86 >57.84 mL/min   Calcium 8.8 8.4 - 10.5 mg/dL  Lipid panel  Result Value Ref Range   Cholesterol 159 0 - 200 mg/dL   Triglycerides 69.6 0.0 - 149.0  mg/dL   HDL 29.52 (L) >84.13 mg/dL   VLDL 24.4 0.0 - 01.0 mg/dL   LDL Cholesterol 272 (H) 0 - 99 mg/dL   Total CHOL/HDL Ratio 5    NonHDL 124.60     Assessment & Plan:   Problem List Items Addressed This Visit     GERD (gastroesophageal reflux disease)    Managed with PRN PPI. Does not need refill at this time.      Relevant Medications   omeprazole (PRILOSEC) 40 MG capsule   Health maintenance examination - Primary (Chronic)    Preventative protocols reviewed and updated unless pt declined. Discussed healthy diet and lifestyle.       Essential hypertension    Chronic, stable. Continue current regimen.       Relevant Medications   amLODipine (NORVASC) 5 MG tablet   Obesity, morbid, BMI 40.0-49.9 (HCC)    Encouraged healthy diet and lifestyle choices to affect sustainable weight loss.       Nonspecific abnormal electrocardiogram (ECG) (EKG)   Dyslipidemia    Chronic off meds. He does have fmhx CAD. Check Lp(a) next labwork.  Will also refer for coronary CT with calcium score.  The 10-year ASCVD risk score (Arnett DK, et al., 2019) is: 12.5%   Values used to calculate the score:     Age: 20 years     Sex: Male     Is Non-Hispanic African American: No     Diabetic: No  Tobacco smoker: No     Systolic Blood Pressure: 134 mmHg     Is BP treated: Yes     HDL Cholesterol: 34.3 mg/dL     Total Cholesterol: 159 mg/dL       Pedal edema    Chronic LLE swelling, persists.      Other Visit Diagnoses     Special screening for malignant neoplasms, colon       Relevant Orders   Fecal occult blood, imunochemical   Abnormal electrocardiogram       Relevant Orders   CT CARDIAC SCORING (SELF PAY ONLY)        Meds ordered this encounter  Medications   amLODipine (NORVASC) 5 MG tablet    Sig: Take 1 tablet (5 mg total) by mouth daily.    Dispense:  90 tablet    Refill:  4   acetaminophen (TYLENOL 8 HOUR ARTHRITIS PAIN) 650 MG CR tablet    Sig: Take 2 tablets  (1,300 mg total) by mouth daily as needed for pain.   Cholecalciferol (VITAMIN D3) 25 MCG (1000 UT) CAPS    Sig: Take 1 capsule (1,000 Units total) by mouth daily.    Dispense:  30 capsule    Orders Placed This Encounter  Procedures   Fecal occult blood, imunochemical    Standing Status:   Future    Standing Expiration Date:   01/30/2024   CT CARDIAC SCORING (SELF PAY ONLY)    Standing Status:   Future    Standing Expiration Date:   01/30/2024    Order Specific Question:   Preferred imaging location?    Answer:   [facility]    Patient Instructions  Pass by lab to pick up stool kit.  We will refer you for heart CT with calcium score in [place]  Continue amlodipine.  Work on diet and activity to improve cholesterol levels and for weight loss  Good to see you today Return as needed or in 1 year for next physical.   Follow up plan: Return in about 1 year (around 01/30/2024) for annual exam, prior fasting for blood work.  Eustaquio Boyden, MD

## 2023-01-30 NOTE — Assessment & Plan Note (Signed)
Chronic, stable. Continue current regimen. 

## 2023-01-30 NOTE — Assessment & Plan Note (Signed)
Managed with PRN PPI. Does not need refill at this time.

## 2023-01-30 NOTE — Assessment & Plan Note (Addendum)
Chronic LLE swelling, persists.

## 2023-01-30 NOTE — Patient Instructions (Addendum)
Pass by lab to pick up stool kit.  We will refer you for heart CT with calcium score in   Continue amlodipine.  Work on diet and activity to improve cholesterol levels and for weight loss  Good to see you today Return as needed or in 1 year for next physical.

## 2023-01-30 NOTE — Assessment & Plan Note (Signed)
Encouraged healthy diet and lifestyle choices to affect sustainable weight loss.  ?

## 2023-02-03 ENCOUNTER — Other Ambulatory Visit (INDEPENDENT_AMBULATORY_CARE_PROVIDER_SITE_OTHER): Payer: 59 | Admitting: *Deleted

## 2023-02-03 DIAGNOSIS — Z1211 Encounter for screening for malignant neoplasm of colon: Secondary | ICD-10-CM

## 2023-02-04 LAB — FECAL OCCULT BLOOD, IMMUNOCHEMICAL: Fecal Occult Bld: NEGATIVE

## 2023-02-06 ENCOUNTER — Ambulatory Visit
Admission: RE | Admit: 2023-02-06 | Discharge: 2023-02-06 | Disposition: A | Discharge status: Home / Self Care | Payer: 59 | Source: Ambulatory Visit | Attending: Family Medicine | Admitting: Family Medicine

## 2023-02-06 DIAGNOSIS — R9431 Abnormal electrocardiogram [ECG] [EKG]: Secondary | ICD-10-CM | POA: Insufficient documentation

## 2023-02-07 ENCOUNTER — Other Ambulatory Visit: Payer: Self-pay | Admitting: Family Medicine

## 2023-02-07 ENCOUNTER — Encounter: Payer: Self-pay | Admitting: Family Medicine

## 2023-02-07 DIAGNOSIS — E785 Hyperlipidemia, unspecified: Secondary | ICD-10-CM

## 2023-02-07 DIAGNOSIS — R931 Abnormal findings on diagnostic imaging of heart and coronary circulation: Secondary | ICD-10-CM | POA: Insufficient documentation

## 2023-02-07 MED ORDER — ASPIRIN 81 MG PO TBEC: 81.0000 mg | DELAYED_RELEASE_TABLET | Freq: Every day | ORAL | 12 refills | Status: AC

## 2023-02-09 ENCOUNTER — Other Ambulatory Visit: Payer: Self-pay

## 2023-02-09 MED ORDER — ROSUVASTATIN CALCIUM 20 MG PO TABS: 20.0000 mg | ORAL_TABLET | Freq: Every day | ORAL | 3 refills | Status: DC

## 2023-02-12 ENCOUNTER — Telehealth: Payer: Self-pay | Admitting: Family Medicine

## 2023-02-12 NOTE — Telephone Encounter (Signed)
Patient called in returning a call he received. Thank you! 

## 2023-02-12 NOTE — Telephone Encounter (Signed)
Spoke with pt. (See Imaging, Results Notes- 02/06/23.)

## 2023-02-20 ENCOUNTER — Ambulatory Visit: Payer: 59 | Attending: Medical | Admitting: Medical

## 2023-02-20 ENCOUNTER — Encounter: Payer: Self-pay | Admitting: Medical

## 2023-02-20 VITALS — BP 140/92 | HR 81 | Ht 72.0 in | Wt 297.2 lb

## 2023-02-20 DIAGNOSIS — R079 Chest pain, unspecified: Secondary | ICD-10-CM

## 2023-02-20 DIAGNOSIS — E782 Mixed hyperlipidemia: Secondary | ICD-10-CM

## 2023-02-20 DIAGNOSIS — I25118 Atherosclerotic heart disease of native coronary artery with other forms of angina pectoris: Secondary | ICD-10-CM

## 2023-02-20 DIAGNOSIS — I10 Essential (primary) hypertension: Secondary | ICD-10-CM | POA: Diagnosis not present

## 2023-02-20 NOTE — Patient Instructions (Addendum)
Medication Instructions:  Your physician recommends that you continue on your current medications as directed. Please refer to the Current Medication list given to you today.   *If you need a refill on your cardiac medications before your next appointment, please call your pharmacy*   Lab Work: none If you have labs (blood work) drawn today and your tests are completely normal, you will receive your results only by: MyChart Message (if you have MyChart) OR A paper copy in the mail If you have any lab test that is abnormal or we need to change your treatment, we will call you to review the results.   Testing/Procedures: Your provider has ordered a exercise tolerance test. This test will evaluate the blood supply to your heart muscle during periods of exercise and rest. For this test, you will raise your heart rate by walking on a treadmill at different levels.   you may eat a light breakfast/ lunch prior to your procedure no caffeine for 24 hours prior to your test (coffee, tea, soft drinks, or chocolate)  no smoking/ vaping for 4 hours prior to your test you may take your regular medications the day of your test bring any inhalers with you to your test wear comfortable clothing & tennis/ non-skid shoes to walk on the treadmill  This will take place at [address], [place] [address]   Follow-Up: At [facility], you and your health needs are our priority.  As part of our continuing mission to provide you with exceptional heart care, we have created designated Provider Care Teams.  These Care Teams include your primary Cardiologist (physician) and Advanced Practice Providers (APPs -  Physician Assistants and Nurse Practitioners) who all work together to provide you with the care you need, when you need it.  We recommend signing up for the patient portal called "MyChart".  Sign up information is provided on this After Visit Summary.  MyChart is  used to connect with patients for Virtual Visits (Telemedicine).  Patients are able to view lab/test results, encounter notes, upcoming appointments, etc.  Non-urgent messages can be sent to your provider as well.   To learn more about what you can do with MyChart, go to [email or web address].    Your next appointment:   1 year(s)  Provider:   You may see Debbe Odea, MD or one of the following Advanced Practice Providers on your designated Care Team:   Nicolasa Ducking, NP Eula Listen, PA-C Cadence Fransico Michael, PA-C Charlsie Quest, NP

## 2023-02-20 NOTE — Progress Notes (Signed)
Cardiology Office Note:    Date:  02/20/2023   ID:  Phillip Miles, DOB [birth year], MRN [id number]  PCP:  Eustaquio Boyden, MD  [facility] Cardiologist:  None  [facility] Electrophysiologist:  None   Referring MD: Eustaquio Boyden, MD   Chief Complaint: Cardiac score follow-up  History of Present Illness:    Phillip Miles is a 62 y.o. male with a hx of HTN, former smoker who is being seen for cardiac score follow-up.   Echo in 208 showed LVEF 55-60%, no WMA.   Patient was referred to cardiology in 2022 for abnormal EKG. EKG showed anteroseptal q waves. Patient was without symptoms, so no further work-up was pursued.   The patient reports recent cardiac scoring 470. He reports intermittent chest tightness. It's worse after eating and when he lays down. No exertional symptoms. He has a lot of stress at work. He wakes up and heart is racing. No shortness of breath. Has dependent lower leg edema. He stands a lot and walks a lot. He wears compression socks He walks a lot 2-3 miles a week at work. Diet is terrible. He eats out a lot. He is taking Prilosec for Acid reflux.   Past Medical History:  Diagnosis Date   Acute biliary pancreatitis 2014   s/p cholecystectomy   Arthritis    Blood pressure elevated without history of HTN    COVID-19 virus infection 07/2019   Depression    h/o, job related, resolved as of 2013   Elbow fracture    left   GERD (gastroesophageal reflux disease)    Hypertension    Nocturia    OSA (obstructive sleep apnea) 07/2014   sleep study in chart - AHI 33, RI 36, lowest sat 76%   Seasonal allergies     Past Surgical History:  Procedure Laterality Date   CHOLECYSTECTOMY N/A 05/13/2013   biliary pancreatitis; Atilano Ina, MD   COLONOSCOPY  01/2012   HP, rpt 10 yrs Leone Payor)   REFRACTIVE SURGERY     Lasik [facility]    Current Medications: Current Meds  Medication Sig   acetaminophen (TYLENOL 8 HOUR ARTHRITIS PAIN) 650 MG CR  tablet Take 2 tablets (1,300 mg total) by mouth daily as needed for pain.   amLODipine (NORVASC) 5 MG tablet Take 1 tablet (5 mg total) by mouth daily.   aspirin EC 81 MG tablet Take 1 tablet (81 mg total) by mouth daily. Swallow whole.   Cholecalciferol (VITAMIN D3) 25 MCG (1000 UT) CAPS Take 1 capsule (1,000 Units total) by mouth daily.   ibuprofen (ADVIL,MOTRIN) 200 MG tablet Take 200 mg every 6 (six) hours as needed by mouth.   loratadine (CLARITIN) 10 MG tablet Take 10 mg by mouth daily as needed for allergies.   Magnesium 500 MG CAPS Take 1 capsule by mouth daily.   Misc Natural Products (GLUCOSAMINE-CHONDROITIN SULF PO) Take 1 capsule by mouth.   Multiple Vitamin (MULTIVITAMIN) tablet Take 1 tablet by mouth daily.   mupirocin ointment (BACTROBAN) 2 % Apply 1 application topically daily. Qd to cyst on back   omeprazole (PRILOSEC) 40 MG capsule Take 1 capsule (40 mg total) by mouth daily as needed (GERD symptoms).   rosuvastatin (CRESTOR) 20 MG tablet Take 1 tablet (20 mg total) by mouth daily.     Allergies:   Patient has no known allergies.   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: [employer]  Tobacco Use   Smoking status: Former    Packs/day: 2.00    Years: 23.00    Additional pack years: 0.00    Total pack years: 46.00    Types: Cigarettes    Quit date: 09/08/1997    Years since quitting: 25.4   Smokeless tobacco: Never  Vaping Use   Vaping Use: Never used  Substance and Sexual Activity   Alcohol use: Yes    Comment: infrequent   Drug use: No   Sexual activity: Not on file  Other Topics Concern   Not on file  Social History Narrative   Married 1991, no kids   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, 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, 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 There is no history of Stroke.  ROS:   Please see the history of present illness.     All other systems reviewed and are negative.  EKGs/Labs/Other Studies Reviewed:    The following studies were reviewed today:  Cardiac scoring 01/2023   IMPRESSION AND RECOMMENDATION: 1. Coronary calcium score of 470. This was 88th percentile for age and sex matched control.   2. CAC >300 in LAD, LCx. CAC-DRS A3/N2.   3. Recommend aspirin and statin if no contraindication.   4. Consider cardiology consultation   5. Continue heart healthy lifestyle and risk factor modification.   Electronically Signed: By: Debbe Odea M.D. On: 02/06/2023 18:03  Echo 2018 Study Conclusions   - Left ventricle: The cavity size was normal. There was mild    concentric hypertrophy. Systolic function was normal. The    estimated ejection fraction was in the range of 55% to 60%. Wall    motion was normal; there were no regional wall motion    abnormalities.  - Left atrium: The atrium was mildly to moderately dilated.  - Right ventricle: The cavity size was mildly dilated. Wall    thickness was normal.   EKG:  EKG is ordered today.  The ekg ordered today demonstrates NSR 81bpm, septal q waves, TWI aVL  Recent Labs: 01/23/2023: ALT 20; BUN 22; Creatinine, Ser 1.23; Potassium 4.2; Sodium 139  Recent Lipid Panel    Component Value Date/Time   CHOL 159  01/23/2023 0727   CHOL 167 07/11/2014 0000   TRIG 98.0 01/23/2023 0727   TRIG 175 07/11/2014 0000   HDL 34.30 (L) 01/23/2023 0727   CHOLHDL 5 01/23/2023 0727   VLDL 19.6 01/23/2023 0727   LDLCALC 105 (H) 01/23/2023 0727   LDLCALC 104 07/11/2014 0000   LDLDIRECT 126.0 07/17/2017 0836     Physical Exam:    VS:  BP (!) 140/92 (BP Location: Left Arm, Patient Position: Sitting, Cuff Size: Large)   Pulse 81   Ht  6' (1.829 m)   Wt 297 lb 4 oz (134.8 kg)   SpO2 97%   BMI 40.31 kg/m     Wt Readings from Last 3 Encounters:  02/20/23 297 lb 4 oz (134.8 kg)  01/30/23 (!) 301 lb 4 oz (136.6 kg)  01/24/22 295 lb 2 oz (133.9 kg)     GEN:  Well nourished, well developed in no acute distress HEENT: Normal NECK: No JVD; No carotid bruits LYMPHATICS: No lymphadenopathy CARDIAC: RRR, no murmurs, rubs, gallops RESPIRATORY:  Clear to auscultation without rales, wheezing or rhonchi  ABDOMEN: Soft, non-tender, non-distended MUSCULOSKELETAL:  No edema; No deformity  SKIN: Warm and dry NEUROLOGIC:  Alert and oriented x 3 PSYCHIATRIC:  Normal affect   ASSESSMENT:    1. Coronary artery disease involving native coronary artery of native heart with other form of angina pectoris (HCC)   2. Chest pain of uncertain etiology   3. Essential hypertension   4. Hyperlipidemia, mixed    PLAN:    In order of problems listed above:  CAD Recent coronary calcium score of 470, 88th percentile for age and sex matched control. CAC>300 LAD, Lcx. Patient was started on Aspirin and Lipitor, and referred to cardiology. The patient reports chest tightness that is worse after food and with laying down, mostly atypical. He takes Prilosec for Acid reflux. Patient walks 2-3 miles at work daily without anginal symptoms. Given calcium score, symptoms and RF, I will order a ETT.   HTN BP is mildly elevated. He takes amlodipine 5mg  daily. I recommended he monitor this at home. Lifestyle changes discussed.  HLD LDL  105, started on Lipitor 20mg  daily. PCP can re-check LFT's/lipid panel in 6-8 weeks. LDL goal<70.  Disposition: Follow up in 1 year(s) with MD/APP    Shared Decision Making/Informed Consent   Informed Consent   Shared Decision Making/Informed Consent The risks [chest pain, shortness of breath, cardiac arrhythmias, dizziness, blood pressure fluctuations, myocardial infarction, stroke/transient ischemic attack, and life-threatening complications (estimated to be 1 in 10,000)], benefits (risk stratification, diagnosing coronary artery disease, treatment guidance) and alternatives of an exercise tolerance test were discussed in detail with Mr. Delao and he agrees to proceed.       Signed, Tyvon Eggenberger David Stall, PA-C  02/20/2023 3:35 PM    [facility]

## 2023-03-27 ENCOUNTER — Ambulatory Visit
Admission: RE | Admit: 2023-03-27 | Discharge: 2023-03-27 | Disposition: A | Discharge status: Home / Self Care | Payer: 59 | Source: Ambulatory Visit | Attending: Medical | Admitting: Medical

## 2023-03-27 DIAGNOSIS — R030 Elevated blood-pressure reading, without diagnosis of hypertension: Secondary | ICD-10-CM | POA: Insufficient documentation

## 2023-03-27 DIAGNOSIS — I493 Ventricular premature depolarization: Secondary | ICD-10-CM | POA: Diagnosis not present

## 2023-03-27 DIAGNOSIS — R079 Chest pain, unspecified: Secondary | ICD-10-CM

## 2023-03-27 DIAGNOSIS — I2489 Other forms of acute ischemic heart disease: Secondary | ICD-10-CM | POA: Insufficient documentation

## 2023-03-27 LAB — EXERCISE TOLERANCE TEST: Angina Index: 0

## 2023-03-31 LAB — EXERCISE TOLERANCE TEST
Duke Treadmill Score: 0
Estimated workload: 7
Exercise duration (min): 5 min
Exercise duration (sec): 25 s
MPHR: 159 {beats}/min
Peak HR: 142 {beats}/min
Percent HR: 89 %
Rest HR: 64 {beats}/min
ST Depression (mm): 1 mm

## 2023-04-02 ENCOUNTER — Other Ambulatory Visit: Payer: Self-pay | Admitting: Family Medicine

## 2023-04-02 DIAGNOSIS — K219 Gastro-esophageal reflux disease without esophagitis: Secondary | ICD-10-CM

## 2023-04-10 ENCOUNTER — Encounter: Payer: Self-pay | Admitting: Cardiology

## 2023-04-10 ENCOUNTER — Ambulatory Visit: Payer: 59 | Admitting: Cardiology

## 2023-04-10 VITALS — BP 140/90 | HR 71 | Ht 72.0 in | Wt 306.2 lb

## 2023-04-10 DIAGNOSIS — I25118 Atherosclerotic heart disease of native coronary artery with other forms of angina pectoris: Secondary | ICD-10-CM

## 2023-04-10 DIAGNOSIS — E7800 Pure hypercholesterolemia, unspecified: Secondary | ICD-10-CM

## 2023-04-10 DIAGNOSIS — I10 Essential (primary) hypertension: Secondary | ICD-10-CM

## 2023-04-10 DIAGNOSIS — R072 Precordial pain: Secondary | ICD-10-CM | POA: Diagnosis not present

## 2023-04-10 MED ORDER — AMLODIPINE BESYLATE 10 MG PO TABS
10.0000 mg | ORAL_TABLET | Freq: Every day | ORAL | Status: DC
Start: 2023-04-10 — End: 2023-05-18

## 2023-04-10 NOTE — Progress Notes (Signed)
Cardiology Office Note:    Date:  04/10/2023   ID:  Phillip Miles, DOB [birth year], MRN [id number]  PCP:  Eustaquio Boyden, MD   [facility] Providers Cardiologist:  Debbe Odea, MD     Referring MD: Eustaquio Boyden, MD   Chief Complaint  Patient presents with   Follow-up    ABN stress test. Meds reviewed verbally with pt.    History of Present Illness:    Phillip Miles is a 62 y.o. male with a hx of hypertension, former smoker x10 to 15 years who presents for follow up.    Patient had a coronary calcium score 3 months ago which was elevated at 470.  Had a follow-up visit 2 months ago with complaints of chest pain.  ETT was obtained last month at follow-up visit which showed ST changes.  Aspirin and Crestor was started after coronary calcifications were noted on calcium score.  Tolerating medications no adverse effects.  Overall doing okay, denies any chest pain currently.  Prior notes/studies  Calcium score 01/2023 = 470, (LAD 399, left circumflex 71.1) Echo 2018  EF 55 to 60%.   Past Medical History:  Diagnosis Date   Acute biliary pancreatitis 2014   s/p cholecystectomy   Arthritis    Blood pressure elevated without history of HTN    COVID-19 virus infection 07/2019   Depression    h/o, job related, resolved as of 2013   Elbow fracture    left   GERD (gastroesophageal reflux disease)    Hypertension    Nocturia    OSA (obstructive sleep apnea) 07/2014   sleep study in chart - AHI 33, RI 36, lowest sat 76%   Seasonal allergies     Past Surgical History:  Procedure Laterality Date   CHOLECYSTECTOMY N/A 05/13/2013   biliary pancreatitis; Atilano Ina, MD   COLONOSCOPY  01/2012   HP, rpt 10 yrs Leone Payor)   REFRACTIVE SURGERY     Lasik [facility]    Current Medications: Current Meds  Medication Sig   acetaminophen (TYLENOL 8 HOUR ARTHRITIS PAIN) 650 MG CR tablet Take 2 tablets (1,300 mg total) by mouth daily as needed for pain.   aspirin EC  81 MG tablet Take 1 tablet (81 mg total) by mouth daily. Swallow whole.   Cholecalciferol (VITAMIN D3) 25 MCG (1000 UT) CAPS Take 1 capsule (1,000 Units total) by mouth daily.   ibuprofen (ADVIL,MOTRIN) 200 MG tablet Take 200 mg every 6 (six) hours as needed by mouth.   loratadine (CLARITIN) 10 MG tablet Take 10 mg by mouth daily as needed for allergies.   Magnesium 500 MG CAPS Take 1 capsule by mouth daily.   Misc Natural Products (GLUCOSAMINE-CHONDROITIN SULF PO) Take 1 capsule by mouth.   Multiple Vitamin (MULTIVITAMIN) tablet Take 1 tablet by mouth daily.   mupirocin ointment (BACTROBAN) 2 % Apply 1 application topically daily. Qd to cyst on back   omeprazole (PRILOSEC) 40 MG capsule TAKE 1 CAPSULE (40 MG TOTAL) BY MOUTH DAILY AS NEEDED (GERD SYMPTOMS).   rosuvastatin (CRESTOR) 20 MG tablet Take 1 tablet (20 mg total) by mouth daily.   [DISCONTINUED] amLODipine (NORVASC) 5 MG tablet Take 1 tablet (5 mg total) by mouth daily.     Allergies:   Patient has no known allergies.   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: [employer]  Tobacco Use   Smoking status: Former    Current packs/day: 0.00    Average packs/day: 2.0 packs/day for 23.0 years (46.0 ttl pk-yrs)    Types: Cigarettes    Start date: 09/08/1974    Quit date: 09/08/1997    Years since quitting: 25.6   Smokeless tobacco: Never  Vaping Use   Vaping status: Never Used  Substance and Sexual Activity   Alcohol use: Yes    Comment: infrequent   Drug use: No   Sexual activity: Not on file  Other Topics Concern   Not on file  Social History Narrative   Married 1991, no kids   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, 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, 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 There is no history of Stroke.  ROS:   Please see the history of present illness.     All other systems reviewed and are negative.  EKGs/Labs/Other Studies Reviewed:    The following studies were reviewed today:   EKG:  EKG not  ordered today.    Recent Labs: 01/23/2023: ALT 20; BUN 22; Creatinine, Ser 1.23; Potassium 4.2; Sodium 139  Recent Lipid Panel    Component Value Date/Time   CHOL 159 01/23/2023 0727   CHOL 167 07/11/2014 0000   TRIG 98.0 01/23/2023 0727   TRIG 175 07/11/2014 0000   HDL 34.30 (L) 01/23/2023 0727   CHOLHDL 5 01/23/2023 0727   VLDL 19.6 01/23/2023 0727   LDLCALC 105 (H) 01/23/2023 0727   LDLCALC 104 07/11/2014 0000   LDLDIRECT 126.0 07/17/2017 0836     Risk Assessment/Calculations:          Physical Exam:    VS:  BP (!) 140/90 (BP Location: Left Arm, Patient Position: Sitting, Cuff Size: Large)   Pulse 71   Ht 6' (1.829 m)   Wt (!) 306 lb 4 oz (138.9 kg)   SpO2 98%   BMI 41.53 kg/m     Wt Readings from Last 3 Encounters:  04/10/23 (!) 306 lb 4 oz (138.9 kg)  02/20/23 297 lb 4 oz (134.8 kg)  01/30/23 (!) 301 lb 4 oz (136.6 kg)     GEN:  Well nourished, well developed in no acute distress HEENT: Normal NECK: No JVD; No carotid bruits CARDIAC: RRR, no murmurs, rubs, gallops RESPIRATORY:  Clear to auscultation without rales, wheezing or rhonchi  ABDOMEN: Soft, non-tender, non-distended MUSCULOSKELETAL:  No edema; No deformity   SKIN: Warm and dry NEUROLOGIC:  Alert and oriented x 3 PSYCHIATRIC:  Normal affect   ASSESSMENT:    1. Coronary artery disease involving native coronary artery of native heart with other form of angina pectoris (HCC)   2. Precordial pain   3. Primary hypertension   4. Pure hypercholesterolemia   5. Essential hypertension  PLAN:    In order of problems listed above:  CAD, chest pain, abnormal ETT.  continue aspirin 81 mg, Crestor 20 mg daily.  Recommend left heart cath, repeat echocardiogram.  Patient wants to check with his insurance company prior to scheduling left heart cath due to cost issues.  He will let us know how he wants to proceed. Hypertension, BP elevated.  Increase Norvasc to 10 mg daily. Hyperlipidemia, recently started on Crestor.  Continue Crestor 20 mg daily.  Follow-up in 3 months.     Medication Adjustments/Labs and Tests Ordered: Current medicines are reviewed at length with the patient today.  Concerns regarding medicines are outlined above.  No orders of the defined types were placed in this encounter.   Meds ordered this encounter  Medications   amLODipine (NORVASC) 10 MG tablet    Sig: Take 1 tablet (10 mg total) by mouth daily.     Patient Instructions  Medication Instructions:   INCREASE Amlodipine - Take one tablet ( 10mg ) by mouth daily.   *If you need a refill on your cardiac medications before your next appointment, please call your pharmacy*   Lab Work:  None Ordered  If you have labs (blood work) drawn today and your tests are completely normal, you will receive your results only by: MyChart Message (if you have MyChart) OR A paper copy in the mail If you have any lab test that is abnormal or we need to change your treatment, we will call you to review the results.   Testing/Procedures:  We recommend having an Echocardiogram and a left heart cath.    Follow-Up: At [facility], you and your health needs are our  priority.  As part of our continuing mission to provide you with exceptional heart care, we have created designated Provider Care Teams.  These Care Teams include your primary Cardiologist (physician) and Advanced Practice Providers (APPs -  Physician Assistants and Nurse Practitioners) who all work together to provide you with the care you need, when you need it.  We recommend signing up for the patient portal called "MyChart".  Sign up information is provided on this After Visit Summary.  MyChart is used to connect with patients for Virtual Visits (Telemedicine).  Patients are able to view lab/test results, encounter notes, upcoming appointments, etc.  Non-urgent messages can be sent to your provider as well.   To learn more about what you can do with MyChart, go to [email or web address].    Your next appointment:   3 month(s)  Provider:   You may see Debbe Odea, MD or one of the following Advanced Practice Providers on your designated Care Team:   Nicolasa Ducking, NP Eula Listen, PA-C Cadence Fransico Michael, PA-C Charlsie Quest, NP    Signed, Debbe Odea, MD  04/10/2023 4:43 PM    [facility]

## 2023-04-10 NOTE — Patient Instructions (Signed)
Medication Instructions:   INCREASE Amlodipine - Take one tablet ( 10mg ) by mouth daily.   *If you need a refill on your cardiac medications before your next appointment, please call your pharmacy*   Lab Work:  None Ordered  If you have labs (blood work) drawn today and your tests are completely normal, you will receive your results only by: MyChart Message (if you have MyChart) OR A paper copy in the mail If you have any lab test that is abnormal or we need to change your treatment, we will call you to review the results.   Testing/Procedures:  We recommend having an Echocardiogram and a left heart cath.    Follow-Up: At [facility], you and your health needs are our priority.  As part of our continuing mission to provide you with exceptional heart care, we have created designated Provider Care Teams.  These Care Teams include your primary Cardiologist (physician) and Advanced Practice Providers (APPs -  Physician Assistants and Nurse Practitioners) who all work together to provide you with the care you need, when you need it.  We recommend signing up for the patient portal called "MyChart".  Sign up information is provided on this After Visit Summary.  MyChart is used to connect with patients for Virtual Visits (Telemedicine).  Patients are able to view lab/test results, encounter notes, upcoming appointments, etc.  Non-urgent messages can be sent to your provider as well.   To learn more about what you can do with MyChart, go to [email or web address].    Your next appointment:   3 month(s)  Provider:   You may see Debbe Odea, MD or one of the following Advanced Practice Providers on your designated Care Team:   Nicolasa Ducking, NP Eula Listen, PA-C Cadence Fransico Michael, PA-C Charlsie Quest, NP

## 2023-04-30 ENCOUNTER — Telehealth: Payer: Self-pay | Admitting: Cardiology

## 2023-04-30 NOTE — Telephone Encounter (Signed)
Patient's wife is calling to get patient's echo scheduled. Requesting call back once orders are in.

## 2023-04-30 NOTE — Telephone Encounter (Signed)
Attempted to reach the patient's wife, per dpr. The line kept ringing with no voicemail.

## 2023-04-30 NOTE — Telephone Encounter (Signed)
Wife stated patient wants to be scheduled for a left side catheter on Fridays if possible.  Wife requested call back on cell phone# [phone].

## 2023-05-04 ENCOUNTER — Other Ambulatory Visit: Payer: Self-pay

## 2023-05-04 DIAGNOSIS — I25118 Atherosclerotic heart disease of native coronary artery with other forms of angina pectoris: Secondary | ICD-10-CM

## 2023-05-04 NOTE — Telephone Encounter (Signed)
Duplicate encounter - please see other telephone encounter for full conversation.

## 2023-05-04 NOTE — Telephone Encounter (Signed)
Wife came in office today.  Authorized to discuss medical info with lonn luginbill, wife - per [facility]   Scheduled Echocardiogram - 06/05/2023 Scheduled left heart cath for patient for this Friday 05/08/2023.   Patients wife stated she was unsure if he will be able to get his blood work completed or not so she will take everything home review with patient and will call back if she has any questions or has to reschedule anything.    Follow up appt made for 07/10/2023

## 2023-05-05 ENCOUNTER — Telehealth: Payer: Self-pay

## 2023-05-05 NOTE — Telephone Encounter (Signed)
Incoming call from patients wife with a few questions about patients cath.   Patients wife would like to know if it was okay to reschedule cath from 08/30 to 09/06.   Made patients wife aware new arrival time will be 6:30am and his case will start at 7:30am.   Patient wife confirmed patient will be getting pre-procedure labs completed Friday.

## 2023-05-08 ENCOUNTER — Other Ambulatory Visit
Admission: RE | Admit: 2023-05-08 | Discharge: 2023-05-08 | Disposition: A | Discharge status: Home / Self Care | Payer: 59 | Attending: Cardiology | Admitting: Cardiology

## 2023-05-08 DIAGNOSIS — I25118 Atherosclerotic heart disease of native coronary artery with other forms of angina pectoris: Secondary | ICD-10-CM

## 2023-05-08 DIAGNOSIS — R9439 Abnormal result of other cardiovascular function study: Secondary | ICD-10-CM

## 2023-05-08 LAB — BASIC METABOLIC PANEL
Anion gap: 4 — ABNORMAL LOW (ref 5–15)
BUN: 24 mg/dL — ABNORMAL HIGH (ref 8–23)
CO2: 25 mmol/L (ref 22–32)
Calcium: 8.6 mg/dL — ABNORMAL LOW (ref 8.9–10.3)
Chloride: 109 mmol/L (ref 98–111)
Creatinine, Ser: 0.95 mg/dL (ref 0.61–1.24)
GFR, Estimated: >60 mL/min (ref >60)
Glucose, Bld: 91 mg/dL (ref 70–99)
Potassium: 3.8 mmol/L (ref 3.5–5.1)
Sodium: 138 mmol/L (ref 135–145)

## 2023-05-08 LAB — CBC
HCT: 40.9 % (ref 39.0–52.0)
Hemoglobin: 13.7 g/dL (ref 13.0–17.0)
MCH: 29.2 pg (ref 26.0–34.0)
MCHC: 33.5 g/dL (ref 30.0–36.0)
MCV: 87.2 fL (ref 80.0–100.0)
Platelets: 219 10*3/uL (ref 150–400)
RBC: 4.69 MIL/uL (ref 4.22–5.81)
RDW: 13.3 % (ref 11.5–15.5)
WBC: 9 10*3/uL (ref 4.0–10.5)
nRBC: 0 % (ref 0.0–0.2)

## 2023-05-13 ENCOUNTER — Telehealth: Payer: Self-pay | Admitting: *Deleted

## 2023-05-13 ENCOUNTER — Other Ambulatory Visit: Payer: Self-pay | Admitting: Cardiology

## 2023-05-13 DIAGNOSIS — R072 Precordial pain: Secondary | ICD-10-CM

## 2023-05-13 DIAGNOSIS — R9439 Abnormal result of other cardiovascular function study: Secondary | ICD-10-CM

## 2023-05-13 DIAGNOSIS — Z01812 Encounter for preprocedural laboratory examination: Secondary | ICD-10-CM

## 2023-05-13 NOTE — Telephone Encounter (Signed)
Left voicemail message to call back for review of additional information needed before his scheduled procedure on Friday.

## 2023-05-13 NOTE — H&P (View-Only) (Signed)
Cardiology Office Note:    Date:  05/13/2023   ID:  Phillip Miles, DOB [birth year], MRN [id number]  PCP:  Eustaquio Boyden, MD   [facility] Providers Cardiologist:  Debbe Odea, MD     Referring MD: No ref. provider found   No chief complaint on file.   History of Present Illness:    Phillip Miles is a 62 y.o. male with a hx of hypertension, former smoker x10 to 15 years who needs a left heart cath performed.  Previously seen by myself due to abnormal stress test and symptoms of chest pain.  Left heart cath was recommended, patient wanted to check with insurance company before scheduling heart cath.  An updated H&P is needed due to previous visit over 30 days prior to procedure.  Patient is willing to proceed with procedure.  Will order left heart cath, follow-up in the office after cardiac catheterization.   Prior notes/studies  Calcium score 01/2023 = 470, (LAD 399, left circumflex 71.1) Echo 2018  EF 55 to 60%.   Past Medical History:  Diagnosis Date   Acute biliary pancreatitis 2014   s/p cholecystectomy   Arthritis    Blood pressure elevated without history of HTN    COVID-19 virus infection 07/2019   Depression    h/o, job related, resolved as of 2013   Elbow fracture    left   GERD (gastroesophageal reflux disease)    Hypertension    Nocturia    OSA (obstructive sleep apnea) 07/2014   sleep study in chart - AHI 33, RI 36, lowest sat 76%   Seasonal allergies     Past Surgical History:  Procedure Laterality Date   CHOLECYSTECTOMY N/A 05/13/2013   biliary pancreatitis; Atilano Ina, MD   COLONOSCOPY  01/2012   HP, rpt 10 yrs Leone Payor)   REFRACTIVE SURGERY     Lasik [facility]    Current Medications: No outpatient medications have been marked as taking for the 05/13/23 encounter (Orders Only) with Debbe Odea, MD.     Allergies:   Patient has no known allergies.   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: [employer]  Tobacco Use   Smoking status: Former    Current packs/day: 0.00    Average packs/day: 2.0 packs/day for 23.0 years (46.0 ttl pk-yrs)    Types: Cigarettes    Start date: 09/08/1974    Quit date: 09/08/1997    Years since quitting: 25.6   Smokeless tobacco: Never  Vaping Use   Vaping status: Never Used  Substance and Sexual Activity   Alcohol use: Yes    Comment: infrequent   Drug use: No   Sexual activity: Not on file  Other Topics Concern   Not on file  Social History Narrative   Married 1991, no kids   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, 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, 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 There is no history of Stroke.  ROS:   Please see the history of present illness.     All other systems reviewed and are negative.  EKGs/Labs/Other Studies Reviewed:    The following studies were reviewed today:   EKG:  EKG not  ordered today.    Recent Labs: 01/23/2023: ALT 20 05/08/2023: BUN 24; Creatinine, Ser 0.95; Hemoglobin 13.7; Platelets 219; Potassium 3.8; Sodium 138  Recent Lipid Panel    Component Value Date/Time   CHOL 159 01/23/2023 0727   CHOL 167 07/11/2014 0000   TRIG 98.0 01/23/2023 0727   TRIG 175 07/11/2014 0000    HDL 34.30 (L) 01/23/2023 0727   CHOLHDL 5 01/23/2023 0727   VLDL 19.6 01/23/2023 0727   LDLCALC 105 (H) 01/23/2023 0727   LDLCALC 104 07/11/2014 0000   LDLDIRECT 126.0 07/17/2017 0836     Risk Assessment/Calculations:          Physical Exam:    VS:  There were no vitals taken for this visit.    Wt Readings from Last 3 Encounters:  04/10/23 (!) 306 lb 4 oz (138.9 kg)  02/20/23 297 lb 4 oz (134.8 kg)  01/30/23 (!) 301 lb 4 oz (136.6 kg)     Physical exam not performed today. Refer to previous physical exam.  ASSESSMENT:    1. Precordial pain   2. Abnormal stress test    PLAN:    In order of problems listed above:  CAD, chest pain, abnormal ETT.  continue aspirin 81 mg, Crestor 20 mg daily.  Schedule left heart cath, follow-up after.  Follow-up after cardiac catheterization procedure.     Medication Adjustments/Labs and Tests Ordered: Current medicines are reviewed at length with the patient today.  Concerns regarding medicines are outlined above.  No orders of the defined types were placed in this encounter.   No orders of the defined types were placed in this encounter.    There are no Patient Instructions on file for this visit.   Signed, Debbe Odea, MD  05/13/2023 5:02 PM     [facility]

## 2023-05-13 NOTE — Progress Notes (Signed)
Cardiology Office Note:    Date:  05/13/2023   ID:  Phillip Miles, DOB [birth year], MRN [id number]  PCP:  Eustaquio Boyden, MD   [facility] Providers Cardiologist:  Debbe Odea, MD     Referring MD: No ref. provider found   No chief complaint on file.   History of Present Illness:    Phillip Miles is a 62 y.o. male with a hx of hypertension, former smoker x10 to 15 years who needs a left heart cath performed.  Previously seen by myself due to abnormal stress test and symptoms of chest pain.  Left heart cath was recommended, patient wanted to check with insurance company before scheduling heart cath.  An updated H&P is needed due to previous visit over 30 days prior to procedure.  Patient is willing to proceed with procedure.  Will order left heart cath, follow-up in the office after cardiac catheterization.   Prior notes/studies  Calcium score 01/2023 = 470, (LAD 399, left circumflex 71.1) Echo 2018  EF 55 to 60%.   Past Medical History:  Diagnosis Date   Acute biliary pancreatitis 2014   s/p cholecystectomy   Arthritis    Blood pressure elevated without history of HTN    COVID-19 virus infection 07/2019   Depression    h/o, job related, resolved as of 2013   Elbow fracture    left   GERD (gastroesophageal reflux disease)    Hypertension    Nocturia    OSA (obstructive sleep apnea) 07/2014   sleep study in chart - AHI 33, RI 36, lowest sat 76%   Seasonal allergies     Past Surgical History:  Procedure Laterality Date   CHOLECYSTECTOMY N/A 05/13/2013   biliary pancreatitis; Atilano Ina, MD   COLONOSCOPY  01/2012   HP, rpt 10 yrs Leone Payor)   REFRACTIVE SURGERY     Lasik [facility]    Current Medications: No outpatient medications have been marked as taking for the 05/13/23 encounter (Orders Only) with Debbe Odea, MD.     Allergies:   Patient has no known allergies.   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: [employer]  Tobacco Use   Smoking status: Former    Current packs/day: 0.00    Average packs/day: 2.0 packs/day for 23.0 years (46.0 ttl pk-yrs)    Types: Cigarettes    Start date: 09/08/1974    Quit date: 09/08/1997    Years since quitting: 25.6   Smokeless tobacco: Never  Vaping Use   Vaping status: Never Used  Substance and Sexual Activity   Alcohol use: Yes    Comment: infrequent   Drug use: No   Sexual activity: Not on file  Other Topics Concern   Not on file  Social History Narrative   Married 1991, no kids   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, 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, 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 There is no history of Stroke.  ROS:   Please see the history of present illness.     All other systems reviewed and are negative.  EKGs/Labs/Other Studies Reviewed:    The following studies were reviewed today:   EKG:  EKG not  ordered today.    Recent Labs: 01/23/2023: ALT 20 05/08/2023: BUN 24; Creatinine, Ser 0.95; Hemoglobin 13.7; Platelets 219; Potassium 3.8; Sodium 138  Recent Lipid Panel    Component Value Date/Time   CHOL 159 01/23/2023 0727   CHOL 167 07/11/2014 0000   TRIG 98.0 01/23/2023 0727   TRIG 175 07/11/2014 0000    HDL 34.30 (L) 01/23/2023 0727   CHOLHDL 5 01/23/2023 0727   VLDL 19.6 01/23/2023 0727   LDLCALC 105 (H) 01/23/2023 0727   LDLCALC 104 07/11/2014 0000   LDLDIRECT 126.0 07/17/2017 0836     Risk Assessment/Calculations:          Physical Exam:    VS:  There were no vitals taken for this visit.    Wt Readings from Last 3 Encounters:  04/10/23 (!) 306 lb 4 oz (138.9 kg)  02/20/23 297 lb 4 oz (134.8 kg)  01/30/23 (!) 301 lb 4 oz (136.6 kg)     Physical exam not performed today. Refer to previous physical exam.  ASSESSMENT:    1. Precordial pain   2. Abnormal stress test    PLAN:    In order of problems listed above:  CAD, chest pain, abnormal ETT.  continue aspirin 81 mg, Crestor 20 mg daily.  Schedule left heart cath, follow-up after.  Follow-up after cardiac catheterization procedure.     Medication Adjustments/Labs and Tests Ordered: Current medicines are reviewed at length with the patient today.  Concerns regarding medicines are outlined above.  No orders of the defined types were placed in this encounter.   No orders of the defined types were placed in this encounter.    There are no Patient Instructions on file for this visit.   Signed, Debbe Odea, MD  05/13/2023 5:02 PM     [facility]

## 2023-05-13 NOTE — Telephone Encounter (Signed)
Patient's wife is returning call. Transferred to Rinaldo Cloud, Charity fundraiser.

## 2023-05-13 NOTE — Telephone Encounter (Signed)
Spoke with patients wife per release form. Patient will go tomorrow at [facility] to have EKG done. She denied any further questions regarding procedure scheduled for Friday. Order has been placed and she verbalized understanding for him to have EKG done. No further needs at this time.

## 2023-05-14 ENCOUNTER — Ambulatory Visit
Admission: RE | Admit: 2023-05-14 | Discharge: 2023-05-14 | Disposition: A | Discharge status: Home / Self Care | Payer: 59 | Source: Ambulatory Visit | Attending: Cardiology | Admitting: Cardiology

## 2023-05-14 DIAGNOSIS — Z01810 Encounter for preprocedural cardiovascular examination: Secondary | ICD-10-CM | POA: Diagnosis present

## 2023-05-14 DIAGNOSIS — I2510 Atherosclerotic heart disease of native coronary artery without angina pectoris: Secondary | ICD-10-CM | POA: Diagnosis not present

## 2023-05-14 DIAGNOSIS — Z01812 Encounter for preprocedural laboratory examination: Secondary | ICD-10-CM

## 2023-05-15 ENCOUNTER — Observation Stay
Admission: RE | Admit: 2023-05-15 | Discharge: 2023-05-16 | Disposition: A | Discharge status: Home / Self Care | Payer: 59 | Attending: Internal Medicine | Admitting: Internal Medicine

## 2023-05-15 ENCOUNTER — Other Ambulatory Visit: Payer: Self-pay

## 2023-05-15 ENCOUNTER — Encounter
Admission: RE | Disposition: A | Discharge status: Home / Self Care | Payer: Self-pay | Source: Home / Self Care | Attending: Internal Medicine

## 2023-05-15 ENCOUNTER — Encounter: Payer: Self-pay | Admitting: Internal Medicine

## 2023-05-15 ENCOUNTER — Observation Stay (HOSPITAL_BASED_OUTPATIENT_CLINIC_OR_DEPARTMENT_OTHER)
Admission: RE | Admit: 2023-05-15 | Discharge: 2023-05-15 | Disposition: A | Discharge status: Home / Self Care | Payer: 59 | Source: Home / Self Care | Attending: Internal Medicine | Admitting: Internal Medicine

## 2023-05-15 DIAGNOSIS — R072 Precordial pain: Principal | ICD-10-CM

## 2023-05-15 DIAGNOSIS — I10 Essential (primary) hypertension: Secondary | ICD-10-CM | POA: Diagnosis not present

## 2023-05-15 DIAGNOSIS — R9439 Abnormal result of other cardiovascular function study: Secondary | ICD-10-CM | POA: Diagnosis not present

## 2023-05-15 DIAGNOSIS — E785 Hyperlipidemia, unspecified: Secondary | ICD-10-CM | POA: Diagnosis present

## 2023-05-15 DIAGNOSIS — Z8616 Personal history of COVID-19: Secondary | ICD-10-CM | POA: Diagnosis not present

## 2023-05-15 DIAGNOSIS — Z87891 Personal history of nicotine dependence: Secondary | ICD-10-CM | POA: Diagnosis not present

## 2023-05-15 DIAGNOSIS — I2489 Other forms of acute ischemic heart disease: Secondary | ICD-10-CM | POA: Diagnosis not present

## 2023-05-15 DIAGNOSIS — I2510 Atherosclerotic heart disease of native coronary artery without angina pectoris: Secondary | ICD-10-CM | POA: Insufficient documentation

## 2023-05-15 HISTORY — PX: CORONARY STENT INTERVENTION: CATH118234

## 2023-05-15 HISTORY — DX: Localized edema: R60.0

## 2023-05-15 HISTORY — PX: LEFT HEART CATH AND CORONARY ANGIOGRAPHY: CATH118249

## 2023-05-15 LAB — ECHOCARDIOGRAM COMPLETE
AR max vel: 2.7 cm2
AV Area VTI: 2.46 cm2
AV Area mean vel: 2.55 cm2
AV Mean grad: 4 mmHg
AV Peak grad: 6.7 mmHg
Ao pk vel: 1.29 m/s
Area-P 1/2: 3.05 cm2
Height: 72 in
MV VTI: 2.13 cm2
S' Lateral: 3 cm
Weight: 4784 [oz_av]

## 2023-05-15 LAB — POCT ACTIVATED CLOTTING TIME
Activated Clotting Time: 342 s
Activated Clotting Time: 293 s

## 2023-05-15 SURGERY — LEFT HEART CATH AND CORONARY ANGIOGRAPHY
Anesthesia: Moderate Sedation

## 2023-05-15 MED ORDER — HEPARIN SODIUM (PORCINE) 1000 UNIT/ML IJ SOLN
INTRAMUSCULAR | Status: DC | PRN
  Administered 2023-05-15: 2000 [IU] via INTRAVENOUS
  Administered 2023-05-15: 8000 [IU] via INTRAVENOUS
  Administered 2023-05-15: 5000 [IU] via INTRAVENOUS

## 2023-05-15 MED ORDER — HYDRALAZINE HCL 20 MG/ML IJ SOLN: 10.0000 mg | INTRAMUSCULAR | Status: AC | PRN

## 2023-05-15 MED ORDER — PERFLUTREN LIPID MICROSPHERE
1.0000 mL | INTRAVENOUS | Status: AC | PRN
  Administered 2023-05-15: 2 mL via INTRAVENOUS

## 2023-05-15 MED ORDER — CLOPIDOGREL BISULFATE 75 MG PO TABS
75.0000 mg | ORAL_TABLET | Freq: Every day | ORAL | Status: DC
  Administered 2023-05-16: 75 mg via ORAL
  Filled 2023-05-15: qty 1

## 2023-05-15 MED ORDER — NITROGLYCERIN 1 MG/10 ML FOR IR/CATH LAB
INTRA_ARTERIAL | Status: DC | PRN
  Administered 2023-05-15 (×2): 200 ug

## 2023-05-15 MED ORDER — FENTANYL CITRATE (PF) 100 MCG/2ML IJ SOLN
INTRAMUSCULAR | Status: AC
  Filled 2023-05-15: qty 2

## 2023-05-15 MED ORDER — MIDAZOLAM HCL 2 MG/2ML IJ SOLN
INTRAMUSCULAR | Status: DC | PRN
  Administered 2023-05-15: 1 mg via INTRAVENOUS

## 2023-05-15 MED ORDER — ONDANSETRON HCL 4 MG/2ML IJ SOLN: 4.0000 mg | Freq: Four times a day (QID) | INTRAMUSCULAR | Status: DC | PRN

## 2023-05-15 MED ORDER — SODIUM CHLORIDE 0.9% FLUSH
3.0000 mL | Freq: Two times a day (BID) | INTRAVENOUS | Status: DC
  Administered 2023-05-15: 3 mL via INTRAVENOUS

## 2023-05-15 MED ORDER — ALUM & MAG HYDROXIDE-SIMETH 200-200-20 MG/5ML PO SUSP
ORAL | Status: DC | PRN
  Administered 2023-05-15: 30 mL via ORAL

## 2023-05-15 MED ORDER — CLOPIDOGREL BISULFATE 75 MG PO TABS
ORAL_TABLET | ORAL | Status: DC | PRN
  Administered 2023-05-15: 600 mg via ORAL

## 2023-05-15 MED ORDER — ACETAMINOPHEN 325 MG PO TABS: 650.0000 mg | ORAL_TABLET | ORAL | Status: DC | PRN

## 2023-05-15 MED ORDER — HEPARIN (PORCINE) IN NACL 2000-0.9 UNIT/L-% IV SOLN
INTRAVENOUS | Status: DC | PRN
  Administered 2023-05-15: 1000 mL

## 2023-05-15 MED ORDER — IOHEXOL 300 MG/ML  SOLN
INTRAMUSCULAR | Status: DC | PRN
  Administered 2023-05-15: 180 mL

## 2023-05-15 MED ORDER — SODIUM CHLORIDE 0.9 % IV SOLN: INTRAVENOUS | Status: AC

## 2023-05-15 MED ORDER — HEPARIN SODIUM (PORCINE) 1000 UNIT/ML IJ SOLN
INTRAMUSCULAR | Status: AC
  Filled 2023-05-15: qty 10

## 2023-05-15 MED ORDER — VERAPAMIL HCL 2.5 MG/ML IV SOLN
INTRAVENOUS | Status: DC | PRN
  Administered 2023-05-15: 2.5 mg via INTRAVENOUS

## 2023-05-15 MED ORDER — HEPARIN (PORCINE) IN NACL 1000-0.9 UT/500ML-% IV SOLN
INTRAVENOUS | Status: AC
  Filled 2023-05-15: qty 1000

## 2023-05-15 MED ORDER — VERAPAMIL HCL 2.5 MG/ML IV SOLN
INTRAVENOUS | Status: AC
  Filled 2023-05-15: qty 2

## 2023-05-15 MED ORDER — ALUM & MAG HYDROXIDE-SIMETH 200-200-20 MG/5ML PO SUSP
ORAL | Status: AC
  Filled 2023-05-15: qty 30

## 2023-05-15 MED ORDER — AMLODIPINE BESYLATE 10 MG PO TABS
10.0000 mg | ORAL_TABLET | Freq: Every day | ORAL | Status: DC
  Administered 2023-05-16: 10 mg via ORAL
  Filled 2023-05-15: qty 1

## 2023-05-15 MED ORDER — LABETALOL HCL 5 MG/ML IV SOLN: 10.0000 mg | INTRAVENOUS | Status: AC | PRN

## 2023-05-15 MED ORDER — SODIUM CHLORIDE 0.9 % WEIGHT BASED INFUSION
3.0000 mL/kg/h | INTRAVENOUS | Status: DC
  Administered 2023-05-15: 3 mL/kg/h via INTRAVENOUS

## 2023-05-15 MED ORDER — ROSUVASTATIN CALCIUM 10 MG PO TABS
20.0000 mg | ORAL_TABLET | Freq: Every day | ORAL | Status: DC
  Administered 2023-05-16: 20 mg via ORAL
  Filled 2023-05-15: qty 1
  Filled 2023-05-15: qty 2

## 2023-05-15 MED ORDER — CLOPIDOGREL BISULFATE 75 MG PO TABS
ORAL_TABLET | ORAL | Status: AC
  Filled 2023-05-15: qty 8

## 2023-05-15 MED ORDER — SODIUM CHLORIDE 0.9% FLUSH: 3.0000 mL | INTRAVENOUS | Status: DC | PRN

## 2023-05-15 MED ORDER — MIDAZOLAM HCL 2 MG/2ML IJ SOLN
INTRAMUSCULAR | Status: AC
  Filled 2023-05-15: qty 2

## 2023-05-15 MED ORDER — SODIUM CHLORIDE 0.9 % WEIGHT BASED INFUSION: 1.0000 mL/kg/h | INTRAVENOUS | Status: DC

## 2023-05-15 MED ORDER — NITROGLYCERIN 1 MG/10 ML FOR IR/CATH LAB
INTRA_ARTERIAL | Status: AC
  Filled 2023-05-15: qty 10

## 2023-05-15 MED ORDER — ASPIRIN 81 MG PO TBEC
81.0000 mg | DELAYED_RELEASE_TABLET | Freq: Every day | ORAL | Status: DC
  Administered 2023-05-16: 81 mg via ORAL
  Filled 2023-05-15: qty 1

## 2023-05-15 MED ORDER — FENTANYL CITRATE (PF) 100 MCG/2ML IJ SOLN
INTRAMUSCULAR | Status: DC | PRN
  Administered 2023-05-15: 50 ug via INTRAVENOUS

## 2023-05-15 MED ORDER — LIDOCAINE HCL (PF) 1 % IJ SOLN
INTRAMUSCULAR | Status: DC | PRN
  Administered 2023-05-15: 5 mL

## 2023-05-15 MED ORDER — SODIUM CHLORIDE 0.9 % IV SOLN: 250.0000 mL | INTRAVENOUS | Status: DC | PRN

## 2023-05-15 SURGICAL SUPPLY — 17 items
BALLN TREK RX 2.25X12 (BALLOONS) ×2
BALLOON TREK RX 2.25X12 (BALLOONS) IMPLANT
CATH 5FR JL3.5 JR4 ANG PIG MP (CATHETERS) IMPLANT
CATH LAUNCHER 6FR EBU3.5 (CATHETERS) IMPLANT
DEVICE RAD TR BAND REGULAR (VASCULAR PRODUCTS) IMPLANT
DRAPE BRACHIAL (DRAPES) IMPLANT
GLIDESHEATH SLEND SS 6F .021 (SHEATH) IMPLANT
KIT ENCORE 26 ADVANTAGE (KITS) IMPLANT
KIT SYRINGE INJ CVI SPIKEX1 (MISCELLANEOUS) IMPLANT
PACK CARDIAC CATH (CUSTOM PROCEDURE TRAY) ×3 IMPLANT
PROTECTION STATION PRESSURIZED (MISCELLANEOUS) ×2
SET ATX-X65L (MISCELLANEOUS) IMPLANT
STATION PROTECTION PRESSURIZED (MISCELLANEOUS) IMPLANT
STENT ONYX FRONTIER 2.75X12 (Permanent Stent) IMPLANT
WIRE G HI TQ BMW 190 (WIRE) IMPLANT
WIRE ROSEN-J .035X260CM (WIRE) IMPLANT
WIRE RUNTHROUGH .014X180CM (WIRE) IMPLANT

## 2023-05-15 NOTE — Interval H&P Note (Signed)
History and Physical Interval Note:  05/15/2023 7:23 AM  Phillip Miles  has presented today for surgery, with the diagnosis of coronary artery calcification and abnormal stress test.  The various methods of treatment have been discussed with the patient and family. After consideration of risks, benefits and other options for treatment, the patient has consented to  Procedure(s): LEFT HEART CATH AND CORONARY ANGIOGRAPHY (Left) as a surgical intervention.  The patient's history has been reviewed, patient examined, no change in status, stable for surgery.  I have reviewed the patient's chart and labs.  Questions were answered to the patient's satisfaction.    Cath Lab Visit (complete for each Cath Lab visit)  Clinical Evaluation Leading to the Procedure:   ACS: No.  Non-ACS:    Anginal Classification: CCS I  Anti-ischemic medical therapy: Minimal Therapy (1 class of medications)  Non-Invasive Test Results: Intermediate-risk stress test findings: cardiac mortality 1-3%/year  Prior CABG: No previous CABG  Phillip Miles

## 2023-05-15 NOTE — Progress Notes (Signed)
*  PRELIMINARY RESULTS* Echocardiogram 2D Echocardiogram has been performed.  Cristela Blue 05/15/2023, 2:37 PM

## 2023-05-15 NOTE — Plan of Care (Signed)
  Problem: Activity: Goal: Ability to return to baseline activity level will improve Outcome: Progressing   Problem: Education: Goal: Knowledge of General Education information will improve Description: Including pain rating scale, medication(s)/side effects and non-pharmacologic comfort measures Outcome: Progressing   Problem: Health Behavior/Discharge Planning: Goal: Ability to manage health-related needs will improve Outcome: Progressing

## 2023-05-16 DIAGNOSIS — R9439 Abnormal result of other cardiovascular function study: Secondary | ICD-10-CM | POA: Diagnosis not present

## 2023-05-16 DIAGNOSIS — I2510 Atherosclerotic heart disease of native coronary artery without angina pectoris: Secondary | ICD-10-CM | POA: Insufficient documentation

## 2023-05-16 DIAGNOSIS — I25118 Atherosclerotic heart disease of native coronary artery with other forms of angina pectoris: Secondary | ICD-10-CM

## 2023-05-16 DIAGNOSIS — E782 Mixed hyperlipidemia: Secondary | ICD-10-CM

## 2023-05-16 DIAGNOSIS — I10 Essential (primary) hypertension: Secondary | ICD-10-CM

## 2023-05-16 DIAGNOSIS — R072 Precordial pain: Secondary | ICD-10-CM | POA: Diagnosis not present

## 2023-05-16 DIAGNOSIS — Z87891 Personal history of nicotine dependence: Secondary | ICD-10-CM

## 2023-05-16 LAB — BASIC METABOLIC PANEL
Anion gap: 6 (ref 5–15)
BUN: 17 mg/dL (ref 8–23)
CO2: 26 mmol/L (ref 22–32)
Calcium: 8.6 mg/dL — ABNORMAL LOW (ref 8.9–10.3)
Chloride: 109 mmol/L (ref 98–111)
Creatinine, Ser: 0.95 mg/dL (ref 0.61–1.24)
GFR, Estimated: >60 mL/min (ref >60)
Glucose, Bld: 105 mg/dL — ABNORMAL HIGH (ref 70–99)
Potassium: 3.6 mmol/L (ref 3.5–5.1)
Sodium: 141 mmol/L (ref 135–145)

## 2023-05-16 LAB — CBC
HCT: 39.4 % (ref 39.0–52.0)
Hemoglobin: 13.6 g/dL (ref 13.0–17.0)
MCH: 29.6 pg (ref 26.0–34.0)
MCHC: 34.5 g/dL (ref 30.0–36.0)
MCV: 85.7 fL (ref 80.0–100.0)
Platelets: 201 10*3/uL (ref 150–400)
RBC: 4.6 MIL/uL (ref 4.22–5.81)
RDW: 13.6 % (ref 11.5–15.5)
WBC: 8.3 10*3/uL (ref 4.0–10.5)
nRBC: 0 % (ref 0.0–0.2)

## 2023-05-16 MED ORDER — PANTOPRAZOLE SODIUM 40 MG PO TBEC: 40.0000 mg | DELAYED_RELEASE_TABLET | Freq: Every day | ORAL | 3 refills | Status: DC

## 2023-05-16 MED ORDER — CLOPIDOGREL BISULFATE 75 MG PO TABS: 75.0000 mg | ORAL_TABLET | Freq: Every day | ORAL | 3 refills | Status: DC

## 2023-05-16 MED ORDER — DOCUSATE SODIUM 100 MG PO CAPS
100.0000 mg | ORAL_CAPSULE | Freq: Every day | ORAL | Status: DC | PRN
  Administered 2023-05-16: 100 mg via ORAL
  Filled 2023-05-16: qty 1

## 2023-05-16 MED ORDER — POLYETHYLENE GLYCOL 3350 17 G PO PACK
17.0000 g | PACK | Freq: Every day | ORAL | Status: DC | PRN
  Administered 2023-05-16: 17 g via ORAL
  Filled 2023-05-16: qty 1

## 2023-05-16 NOTE — Discharge Summary (Signed)
Discharge Summary    Patient ID: BRADAN COOPERSMITH MRN: [id number]; DOB: 03-29-1961  Admit date: 05/15/2023 Discharge date: 05/16/2023  PCP:  Eustaquio Boyden, MD   [facility] Providers Cardiologist:  Debbe Odea, MD   {  Discharge Diagnoses    Principal Problem:   Precordial pain Active Problems:   Essential hypertension   HLD (hyperlipidemia)   Abnormal stress test   CAD (coronary artery disease)   History of tobacco use   Diagnostic Studies/Procedures    LHC 05/16/23 Conclusions: Severe single-vessel coronary artery disease with hazy 99% stenosis involving large OM2 branch.  There is also moderate proximal mid/LAD and distal LCx disease. Normal left ventricular systolic function (LVEF 55-65%) and filling pressure (LVEDP 10-15 mmHg). Challenging but successful PCI to 9 9% OM 2 stenosis using Onyx Frontier 2.75 x 12 mm drug-eluting stent with 0% residual stenosis and TIMI-3 flow.  Due to unusual takeoff of OM2, the proximal 50% lesion was not covered, as it was difficult to visualize this lesions proximity to the ostium of OM2.   Recommendations: Dual antiplatelet therapy with aspirin and clopidogrel for at least 6 months. Aggressive secondary prevention of coronary artery disease. Overnight observation.   Yvonne Kendall, MD [facility]  Coronary Diagrams  Diagnostic Dominance: Left     Echo 05/15/23  1. Left ventricular ejection fraction, by estimation, is 55 to 60%. The  left ventricle has normal function. The left ventricle has no regional  wall motion abnormalities. There is mild left ventricular hypertrophy.  Left ventricular diastolic parameters  are consistent with Grade I diastolic dysfunction (impaired relaxation).   2. Right ventricular systolic function is normal. The right ventricular  size is normal.   3. The mitral valve is normal in structure. No evidence of mitral valve  regurgitation.   4. The aortic valve is tricuspid. Aortic  valve regurgitation is not  visualized.   _____________   History of Present Illness     JAQUELL BURBA is a 62 y.o. male with a h/o HTN, former smoker, prior calcium score of 470, normal EF by echo in 2018 who presented for outpatient LHC.   The patient recently had an abnormal stress test for chest pain and LHC was recommended.   Hospital Course     Consultants: None  The patient arrived for his scheduled procedure 05/15/23. Left heart cath showed severe single vessel CAD with 99% stenosis involving Om2 branch, moderate p-m LAD and distal Lcx disease, normal LVEF and filling pressures. The patient was treated with challenging but successful PCI to OM2. The patient tolerated the procedure well. He was started on DAPT with ASA and Plavix x 6 months. He was kept overnight for observation. Echo showed LVER 55-60%, mild LVH, G1DD. Patient remained stable overnight. Patient ambulated without difficulty. Cath site right radial, remained stable.   The patient was evaluated by Dr. Mariah Milling 05/16/23 and felt to be stable for discharge.   GEN:  Well nourished, well developed in no acute distress HEENT: Normal NECK: No JVD; No carotid bruits LYMPHATICS: No lymphadenopathy CARDIAC: RRR, no murmurs, rubs, gallops RESPIRATORY:  Clear to auscultation without rales, wheezing or rhonchi  ABDOMEN: Soft, non-tender, non-distended MUSCULOSKELETAL:  No edema; No deformity  SKIN: Warm and dry NEUROLOGIC:  Alert and oriented x 3 PSYCHIATRIC:  Normal affect   Did the patient have an acute coronary syndrome (MI, NSTEMI, STEMI, etc) this admission?:  No  Did the patient have a percutaneous coronary intervention (stent / angioplasty)?:  Yes.     Cath/PCI Registry Performance & Quality Measures: Aspirin prescribed? - Yes ADP Receptor Inhibitor (Plavix/Clopidogrel, Brilinta/Ticagrelor or Effient/Prasugrel) prescribed (includes medically managed patients)? - Yes High Intensity Statin  (Lipitor 40-80mg  or Crestor 20-40mg ) prescribed? - Yes For EF <40%, was ACEI/ARB prescribed? - Not Applicable (EF >/= 40%) For EF <40%, Aldosterone Antagonist (Spironolactone or Eplerenone) prescribed? - Not Applicable (EF >/= 40%) Cardiac Rehab Phase II ordered? - Yes   Discharge Vitals Blood pressure 120/71, pulse 64, temperature 98.5 F (36.9 C), temperature source Oral, resp. rate 20, height 6' (1.829 m), weight 135.6 kg, SpO2 97%.  Filed Weights   05/15/23 0701  Weight: 135.6 kg    Labs & Radiologic Studies    CBC Recent Labs    05/16/23 0632  WBC 8.3  HGB 13.6  HCT 39.4  MCV 85.7  PLT 201   Basic Metabolic Panel Recent Labs    16/10/96 0632  NA 141  K 3.6  CL 109  CO2 26  GLUCOSE 105*  BUN 17  CREATININE 0.95  CALCIUM 8.6*   Liver Function Tests No results for input(s): "AST", "ALT", "ALKPHOS", "BILITOT", "PROT", "ALBUMIN" in the last 72 hours. No results for input(s): "LIPASE", "AMYLASE" in the last 72 hours. High Sensitivity Troponin:   No results for input(s): "TROPONINIHS" in the last 720 hours.  BNP Invalid input(s): "POCBNP" D-Dimer No results for input(s): "DDIMER" in the last 72 hours. Hemoglobin A1C No results for input(s): "HGBA1C" in the last 72 hours. Fasting Lipid Panel No results for input(s): "CHOL", "HDL", "LDLCALC", "TRIG", "CHOLHDL", "LDLDIRECT" in the last 72 hours. Thyroid Function Tests No results for input(s): "TSH", "T4TOTAL", "T3FREE", "THYROIDAB" in the last 72 hours.  Invalid input(s): "FREET3" _____________  ECHOCARDIOGRAM COMPLETE  Result Date: 05/15/2023    ECHOCARDIOGRAM REPORT   Patient Name:   LEXIS PRENATT Date of Exam: 05/15/2023 Medical Rec #:  [id number]      Height:       72.0 in Accession #:    [id number]     Weight:       299.0 lb Date of Birth:  29-Apr-1961     BSA:          2.528 m Patient Age:    61 years       BP:           118/80 mmHg Patient Gender: M              HR:           64 bpm. Exam Location:  [facility]  Procedure: 2D Echo, Cardiac Doppler, Color Doppler and Intracardiac            Opacification Agent Indications:     Acute ischemic heart disease-unspecified I24.9  History:         Patient has prior history of Echocardiogram examinations, most                  recent 09/29/2016. Risk Factors:Hypertension. OSA.  Sonographer:     Cristela Blue Referring Phys:  [id number] CHRISTOPHER END Diagnosing Phys: Debbe Odea MD  Sonographer Comments: Suboptimal apical window. IMPRESSIONS  1. Left ventricular ejection fraction, by estimation, is 55 to 60%. The left ventricle has normal function. The left ventricle has no regional wall motion abnormalities. There is mild left ventricular hypertrophy. Left ventricular diastolic parameters are consistent with Grade I diastolic dysfunction (impaired relaxation).  2. Right  ventricular systolic function is normal. The right ventricular size is normal.  3. The mitral valve is normal in structure. No evidence of mitral valve regurgitation.  4. The aortic valve is tricuspid. Aortic valve regurgitation is not visualized. FINDINGS  Left Ventricle: Left ventricular ejection fraction, by estimation, is 55 to 60%. The left ventricle has normal function. The left ventricle has no regional wall motion abnormalities. Definity contrast agent was given IV to delineate the left ventricular  endocardial borders. The left ventricular internal cavity size was normal in size. There is mild left ventricular hypertrophy. Left ventricular diastolic parameters are consistent with Grade I diastolic dysfunction (impaired relaxation). Right Ventricle: The right ventricular size is normal. No increase in right ventricular wall thickness. Right ventricular systolic function is normal. Left Atrium: Left atrial size was normal in size. Right Atrium: Right atrial size was normal in size. Pericardium: There is no evidence of pericardial effusion. Mitral Valve: The mitral valve is normal in structure. No evidence of  mitral valve regurgitation. MV peak gradient, 5.8 mmHg. The mean mitral valve gradient is 2.0 mmHg. Tricuspid Valve: The tricuspid valve is normal in structure. Tricuspid valve regurgitation is mild. Aortic Valve: The aortic valve is tricuspid. Aortic valve regurgitation is not visualized. Aortic valve mean gradient measures 4.0 mmHg. Aortic valve peak gradient measures 6.7 mmHg. Aortic valve area, by VTI measures 2.46 cm. Pulmonic Valve: The pulmonic valve was not well visualized. Pulmonic valve regurgitation is not visualized. Aorta: The aortic root is normal in size and structure. Venous: The inferior vena cava was not well visualized. IAS/Shunts: No atrial level shunt detected by color flow Doppler.  LEFT VENTRICLE PLAX 2D LVIDd:         4.20 cm   Diastology LVIDs:         3.00 cm   LV e' medial:    6.85 cm/s LV PW:         1.10 cm   LV E/e' medial:  12.2 LV IVS:        1.70 cm   LV e' lateral:   8.81 cm/s LVOT diam:     2.30 cm   LV E/e' lateral: 9.5 LV SV:         69 LV SV Index:   27 LVOT Area:     4.15 cm  RIGHT VENTRICLE RV S prime:     15.70 cm/s TAPSE (M-mode): 2.8 cm LEFT ATRIUM             Index        RIGHT ATRIUM           Index LA diam:        4.20 cm 1.66 cm/m   RA Area:     18.40 cm LA Vol (A2C):   44.2 ml 17.49 ml/m  RA Volume:   45.10 ml  17.84 ml/m LA Vol (A4C):   74.4 ml 29.43 ml/m LA Biplane Vol: 63.5 ml 25.12 ml/m  AORTIC VALVE AV Area (Vmax):    2.70 cm AV Area (Vmean):   2.55 cm AV Area (VTI):     2.46 cm AV Vmax:           129.00 cm/s AV Vmean:          97.100 cm/s AV VTI:            0.282 m AV Peak Grad:      6.7 mmHg AV Mean Grad:      4.0 mmHg LVOT Vmax:  83.80 cm/s LVOT Vmean:        59.500 cm/s LVOT VTI:          0.167 m LVOT/AV VTI ratio: 0.59  AORTA Ao Root diam: 3.70 cm MITRAL VALVE                TRICUSPID VALVE MV Area (PHT): 3.05 cm     TR Peak grad:   8.9 mmHg MV Area VTI:   2.13 cm     TR Vmax:        149.00 cm/s MV Peak grad:  5.8 mmHg MV Mean grad:  2.0  mmHg     SHUNTS MV Vmax:       1.20 m/s     Systemic VTI:  0.17 m MV Vmean:      70.1 cm/s    Systemic Diam: 2.30 cm MV Decel Time: 249 msec MV E velocity: 83.80 cm/s MV A velocity: 112.00 cm/s MV E/A ratio:  0.75 Debbe Odea MD Electronically signed by Debbe Odea MD Signature Date/Time: 05/15/2023/4:31:05 PM    Final    CARDIAC CATHETERIZATION  Result Date: 05/15/2023 Conclusions: Severe single-vessel coronary artery disease with hazy 99% stenosis involving large OM2 branch.  There is also moderate proximal mid/LAD and distal LCx disease. Normal left ventricular systolic function (LVEF 55-65%) and filling pressure (LVEDP 10-15 mmHg). Challenging but successful PCI to 9 9% OM 2 stenosis using Onyx Frontier 2.75 x 12 mm drug-eluting stent with 0% residual stenosis and TIMI-3 flow.  Due to unusual takeoff of OM2, the proximal 50% lesion was not covered, as it was difficult to visualize this lesions proximity to the ostium of OM2. Recommendations: Dual antiplatelet therapy with aspirin and clopidogrel for at least 6 months. Aggressive secondary prevention of coronary artery disease. Overnight observation. Yvonne Kendall, MD [facility]  Disposition   Pt is being discharged home today in good condition.  Follow-up Plans & Appointments     Discharge Instructions     AMB Referral to Cardiac Rehabilitation - Phase II   Complete by: As directed    Diagnosis: Coronary Stents   After initial evaluation and assessments completed: Virtual Based Care may be provided alone or in conjunction with Phase 2 Cardiac Rehab based on patient barriers.: Yes   Intensive Cardiac Rehabilitation (ICR) [facility] location only OR Traditional Cardiac Rehabilitation (TCR) *If criteria for ICR are not met will enroll in TCR [facility] only): Yes        Discharge Medications   Allergies as of 05/16/2023   No Known Allergies      Medication List     STOP taking these medications    ibuprofen 200 MG  tablet Commonly known as: ADVIL   omeprazole 40 MG capsule Commonly known as: PRILOSEC       TAKE these medications    acetaminophen 650 MG CR tablet Commonly known as: Tylenol 8 Hour Arthritis Pain Take 2 tablets (1,300 mg total) by mouth daily as needed for pain.   amLODipine 10 MG tablet Commonly known as: NORVASC Take 1 tablet (10 mg total) by mouth daily.   aspirin EC 81 MG tablet Take 1 tablet (81 mg total) by mouth daily. Swallow whole.   clopidogrel 75 MG tablet Commonly known as: PLAVIX Take 1 tablet (75 mg total) by mouth daily with breakfast. Start taking on: May 17, 2023   GLUCOSAMINE-CHONDROITIN SULF PO Take 1 capsule by mouth.   loratadine 10 MG tablet Commonly known as: CLARITIN Take 10 mg by mouth  daily as needed for allergies.   Magnesium 500 MG Caps Take 1 capsule by mouth daily.   multivitamin tablet Take 1 tablet by mouth daily.   mupirocin ointment 2 % Commonly known as: BACTROBAN Apply 1 application topically daily. Qd to cyst on back   pantoprazole 40 MG tablet Commonly known as: Protonix Take 1 tablet (40 mg total) by mouth daily.   rosuvastatin 20 MG tablet Commonly known as: Crestor Take 1 tablet (20 mg total) by mouth daily.   Vitamin D3 25 MCG (1000 UT) Caps Take 1 capsule (1,000 Units total) by mouth daily.           Outstanding Labs/Studies   None  Duration of Discharge Encounter   Greater than 30 minutes including physician time.  Signed, Alexander Mcauley David Stall, PA-C 05/16/2023, 8:56 AM

## 2023-05-18 ENCOUNTER — Other Ambulatory Visit: Payer: Self-pay

## 2023-05-18 DIAGNOSIS — I10 Essential (primary) hypertension: Secondary | ICD-10-CM

## 2023-05-18 LAB — LIPOPROTEIN A (LPA): Lipoprotein (a): 18.1 nmol/L (ref <75.0)

## 2023-05-18 MED ORDER — AMLODIPINE BESYLATE 10 MG PO TABS: 10.0000 mg | ORAL_TABLET | Freq: Every day | ORAL | 2 refills | Status: DC

## 2023-05-18 NOTE — Group Note (Deleted)

## 2023-05-18 NOTE — Telephone Encounter (Signed)
Requested Prescriptions   Signed Prescriptions Disp Refills   amLODipine (NORVASC) 10 MG tablet 30 tablet 2    Sig: Take 1 tablet (10 mg total) by mouth daily.    Authorizing Provider: Debbe Odea    Ordering User: Guerry Minors

## 2023-05-18 NOTE — Group Note (Deleted)

## 2023-06-05 ENCOUNTER — Other Ambulatory Visit: Payer: 59

## 2023-06-08 ENCOUNTER — Encounter: Payer: Self-pay | Admitting: Family Medicine

## 2023-06-17 ENCOUNTER — Other Ambulatory Visit: Payer: Self-pay | Admitting: Physician Assistant

## 2023-06-17 DIAGNOSIS — M2392 Unspecified internal derangement of left knee: Secondary | ICD-10-CM

## 2023-07-03 ENCOUNTER — Ambulatory Visit
Admission: RE | Admit: 2023-07-03 | Discharge: 2023-07-03 | Disposition: A | Discharge status: Home / Self Care | Payer: 59 | Source: Ambulatory Visit | Attending: Physician Assistant | Admitting: Physician Assistant

## 2023-07-03 DIAGNOSIS — M2392 Unspecified internal derangement of left knee: Secondary | ICD-10-CM

## 2023-07-10 ENCOUNTER — Encounter: Payer: Self-pay | Admitting: Cardiology

## 2023-07-10 ENCOUNTER — Ambulatory Visit: Payer: 59 | Attending: Cardiology | Admitting: Cardiology

## 2023-07-10 VITALS — BP 128/82 | HR 64 | Ht 72.0 in | Wt 302.2 lb

## 2023-07-10 DIAGNOSIS — I2510 Atherosclerotic heart disease of native coronary artery without angina pectoris: Secondary | ICD-10-CM | POA: Diagnosis not present

## 2023-07-10 DIAGNOSIS — E7800 Pure hypercholesterolemia, unspecified: Secondary | ICD-10-CM

## 2023-07-10 DIAGNOSIS — I10 Essential (primary) hypertension: Secondary | ICD-10-CM

## 2023-07-10 NOTE — Progress Notes (Signed)
Cardiology Office Note:    Date:  07/10/2023   ID:  Phillip Miles, DOB [birth year], MRN [id number]  PCP:  Eustaquio Boyden, MD   [facility] Providers Cardiologist:  Debbe Odea, MD     Referring MD: Eustaquio Boyden, MD   Chief Complaint  Patient presents with   Follow-up    Discuss cardiac testing results.  Recent cardiac cath with stent placement.    History of Present Illness:    Phillip Miles is a 62 y.o. male with a hx of CAD/PCI to OM 2 (9/24, 45% proximal LAD), hypertension, former smoker x10 to 15 years who presents for follow-up.  Previously seen for chest pain and abnormal stress test.  Left heart cath was performed showing significant stenosis in second obtuse marginal, drug-eluting stent was placed 05/2023.  Tolerating aspirin, Plavix, Crestor.  Denies any chest pain or shortness of breath.  Denies any bleeding issues with antiplatelets.  Compliant with medications as prescribed.  No concerns today.  Prior notes/studies  Echo 05/15/2023 EF 55 to 60% Calcium score 01/2023 = 470, (LAD 399, left circumflex 71.1) Echo 2018  EF 55 to 60%.   Past Medical History:  Diagnosis Date   Acute biliary pancreatitis 2014   s/p cholecystectomy   Arthritis    COVID-19 virus infection 07/2019   Depression    h/o, job related, resolved as of 2013   Elbow fracture    left   GERD (gastroesophageal reflux disease)    Hypertension    Lower extremity edema    Nocturia    OSA (obstructive sleep apnea) 07/2014   sleep study in chart - AHI 33, RI 36, lowest sat 76%   Seasonal allergies     Past Surgical History:  Procedure Laterality Date   CHOLECYSTECTOMY N/A 05/13/2013   biliary pancreatitis; Phillip Ina, MD   COLONOSCOPY  01/2012   HP, rpt 10 yrs Leone Payor)   CORONARY STENT INTERVENTION N/A 05/15/2023   catheterization with stenting of marginal branch (End, Christopher, MD)   LEFT HEART CATH AND CORONARY ANGIOGRAPHY Left 05/15/2023   Procedure: LEFT HEART CATH  AND CORONARY ANGIOGRAPHY;  Surgeon: Yvonne Kendall, MD;  Location: [facility] INVASIVE CV LAB;  Service: Cardiovascular;  Laterality: Left;   REFRACTIVE SURGERY     Lasik [facility] [place]    Current Medications: Current Meds  Medication Sig   acetaminophen (TYLENOL 8 HOUR ARTHRITIS PAIN) 650 MG CR tablet Take 2 tablets (1,300 mg total) by mouth daily as needed for pain.   amLODipine (NORVASC) 10 MG tablet Take 1 tablet (10 mg total) by mouth daily.   aspirin EC 81 MG tablet Take 1 tablet (81 mg total) by mouth daily. Swallow whole.   clopidogrel (PLAVIX) 75 MG tablet Take 1 tablet (75 mg total) by mouth daily with breakfast.   loratadine (CLARITIN) 10 MG tablet Take 10 mg by mouth daily as needed for allergies.   Magnesium 500 MG CAPS Take 1 capsule by mouth daily.   Misc Natural Products (GLUCOSAMINE-CHONDROITIN SULF PO) Take 1 capsule by mouth.   Multiple Vitamin (MULTIVITAMIN) tablet Take 1 tablet by mouth daily.   pantoprazole (PROTONIX) 40 MG tablet Take 1 tablet (40 mg total) by mouth daily.   rosuvastatin (CRESTOR) 20 MG tablet Take 1 tablet (20 mg total) by mouth daily.   TURMERIC PO Take 3 capsules by mouth daily.     Allergies:   Patient has no known allergies.   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: [employer]  Tobacco Use   Smoking status: Former    Current packs/day: 0.00    Average packs/day: 2.0 packs/day for 23.0 years (46.0 ttl pk-yrs)    Types: Cigarettes    Start date: 09/08/1974    Quit date: 09/08/1997    Years since quitting: 25.8   Smokeless tobacco: Never  Vaping Use   Vaping status: Never Used  Substance and Sexual Activity   Alcohol use: Yes    Comment: occasional   Drug use: No   Sexual activity: Not on file  Other Topics Concern   Not on  file  Social History Narrative   Married 1991, no kids   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, 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, 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 There is no history of Stroke.  ROS:   Please see the history of present illness.     All other systems reviewed and are negative.  EKGs/Labs/Other Studies Reviewed:    The following studies were reviewed today:   EKG Interpretation Date/Time:  Friday July 10 2023 11:09:55 EDT Ventricular Rate:  64 PR Interval:  160 QRS Duration:  84 QT Interval:  402 QTC Calculation: 414 R Axis:   20  Text Interpretation: Normal sinus rhythm Septal infarct (cited on or before 16-May-2023) Confirmed by Debbe Odea ([id number]) on 07/10/2023 11:12:52 AM    Recent Labs: 01/23/2023: ALT 20 05/16/2023: BUN 17; Creatinine, Ser 0.95; Hemoglobin 13.6; Platelets 201; Potassium 3.6; Sodium 141  Recent Lipid Panel    Component Value Date/Time   CHOL 159 01/23/2023 0727   CHOL 167 07/11/2014 0000   TRIG 98.0 01/23/2023 0727   TRIG 175 07/11/2014 0000   HDL 34.30 (L) 01/23/2023 0727   CHOLHDL 5 01/23/2023 0727   VLDL 19.6 01/23/2023 0727   LDLCALC 105 (H) 01/23/2023 0727   LDLCALC 104 07/11/2014 0000   LDLDIRECT 126.0 07/17/2017 0836     Risk Assessment/Calculations:          Physical Exam:    VS:  BP 128/82 (BP Location: Left Arm, Patient Position: Sitting, Cuff Size: Large)   Pulse  64   Ht 6' (1.829 m)   Wt (!) 302 lb 3.2 oz (137.1 kg)   SpO2 96%   BMI 40.99 kg/m     Wt Readings from Last 3 Encounters:  07/10/23 (!) 302 lb 3.2 oz (137.1 kg)  05/15/23 299 lb (135.6 kg)  04/10/23 (!) 306 lb 4 oz (138.9 kg)     Physical exam not performed today. Refer to previous physical exam.  ASSESSMENT:    1. Coronary artery disease involving native coronary artery of native heart, unspecified whether angina present   2. Primary  hypertension   3. Pure hypercholesterolemia    PLAN:    In order of problems listed above:  CAD s/p PCI OM2 9/24.  45% proximal LAD.  Denies chest pain.  Continue aspirin, Plavix, Crestor 20 mg daily.  EF 55 to 60%. Hypertension, BP controlled.  Continue Norvasc 10 mg daily. Hyperlipidemia, continue Crestor 20 mg daily, plans to obtain fasting lipid profile with PCP within next month.  Titrate Crestor if LDL not at goal.  Follow-up in 6 months.    Medication Adjustments/Labs and Tests Ordered: Current medicines are reviewed at length with the patient today.  Concerns regarding medicines are outlined above.  Orders Placed This Encounter  Procedures   EKG 12-Lead    No orders of the defined types were placed in this encounter.    Patient Instructions  Medication Instructions:   Your physician recommends that you continue on your current medications as directed. Please refer to the Current Medication list given to you today.  *If you need a refill on your cardiac medications before your next appointment, please call your pharmacy*   Lab Work:  None Ordered  If you have labs (blood work) drawn today and your tests are completely normal, you will receive your results only by: MyChart Message (if you have MyChart) OR A paper copy in the mail If you have any lab test that is abnormal or we need to change your treatment, we will call you to review the results.   Testing/Procedures:  None Ordered   Follow-Up: At [facility], you and your health needs are our priority.  As part of our continuing mission to provide you with exceptional heart care, we have created designated Provider Care Teams.  These Care Teams include your primary Cardiologist (physician) and Advanced Practice Providers (APPs -  Physician Assistants and Nurse Practitioners) who all work together to provide you with the care you need, when you need it.  We recommend signing up for the patient portal called "MyChart".  Sign up information is provided on this After Visit Summary.  MyChart is used to connect with patients for Virtual Visits (Telemedicine).  Patients are able to view lab/test results, encounter notes, upcoming appointments, etc.  Non-urgent messages can be sent to your provider as well.   To learn more about what you can do with MyChart, go to [email or web address].    Your next appointment:   6 month(s)  Provider:   You may see Debbe Odea, MD or one of the following Advanced Practice Providers on your designated Care Team:   Nicolasa Ducking, NP Eula Listen, PA-C Cadence Fransico Michael, PA-C Charlsie Quest, NP Carlos Levering, NP    Signed, Debbe Odea, MD  07/10/2023 11:36 AM    [facility]

## 2023-07-10 NOTE — Patient Instructions (Signed)
Medication Instructions:   Your physician recommends that you continue on your current medications as directed. Please refer to the Current Medication list given to you today.  *If you need a refill on your cardiac medications before your next appointment, please call your pharmacy*   Lab Work:  None Ordered  If you have labs (blood work) drawn today and your tests are completely normal, you will receive your results only by: MyChart Message (if you have MyChart) OR A paper copy in the mail If you have any lab test that is abnormal or we need to change your treatment, we will call you to review the results.   Testing/Procedures:  None Ordered   Follow-Up: At [facility], you and your health needs are our priority.  As part of our continuing mission to provide you with exceptional heart care, we have created designated Provider Care Teams.  These Care Teams include your primary Cardiologist (physician) and Advanced Practice Providers (APPs -  Physician Assistants and Nurse Practitioners) who all work together to provide you with the care you need, when you need it.  We recommend signing up for the patient portal called "MyChart".  Sign up information is provided on this After Visit Summary.  MyChart is used to connect with patients for Virtual Visits (Telemedicine).  Patients are able to view lab/test results, encounter notes, upcoming appointments, etc.  Non-urgent messages can be sent to your provider as well.   To learn more about what you can do with MyChart, go to [email or web address].    Your next appointment:   6 month(s)  Provider:   You may see Debbe Odea, MD or one of the following Advanced Practice Providers on your designated Care Team:   Nicolasa Ducking, NP Eula Listen, PA-C Cadence Fransico Michael, PA-C Charlsie Quest, NP Carlos Levering, NP

## 2023-08-11 ENCOUNTER — Other Ambulatory Visit: Payer: Self-pay

## 2023-08-11 DIAGNOSIS — I10 Essential (primary) hypertension: Secondary | ICD-10-CM

## 2023-08-11 MED ORDER — AMLODIPINE BESYLATE 10 MG PO TABS: 10.0000 mg | ORAL_TABLET | Freq: Every day | ORAL | 5 refills | Status: DC

## 2023-12-18 ENCOUNTER — Ambulatory Visit: Payer: 59 | Attending: Medical | Admitting: Medical

## 2023-12-18 ENCOUNTER — Encounter: Payer: Self-pay | Admitting: Medical

## 2023-12-18 VITALS — BP 98/60 | HR 61 | Ht 72.0 in | Wt 264.2 lb

## 2023-12-18 DIAGNOSIS — I10 Essential (primary) hypertension: Secondary | ICD-10-CM

## 2023-12-18 DIAGNOSIS — I2510 Atherosclerotic heart disease of native coronary artery without angina pectoris: Secondary | ICD-10-CM | POA: Diagnosis not present

## 2023-12-18 DIAGNOSIS — E7800 Pure hypercholesterolemia, unspecified: Secondary | ICD-10-CM | POA: Diagnosis not present

## 2023-12-18 DIAGNOSIS — Z79899 Other long term (current) drug therapy: Secondary | ICD-10-CM

## 2023-12-18 MED ORDER — AMLODIPINE BESYLATE 5 MG PO TABS
5.0000 mg | ORAL_TABLET | Freq: Every day | ORAL | 3 refills | Status: DC
Start: 2023-12-18 — End: 2024-02-12

## 2023-12-18 MED ORDER — NITROGLYCERIN 0.4 MG SL SUBL: 0.4000 mg | SUBLINGUAL_TABLET | SUBLINGUAL | 3 refills | Status: DC | PRN

## 2023-12-18 NOTE — Progress Notes (Signed)
 Cardiology Office Note:  .   Date:  12/18/2023  ID:  Phillip Miles, DOB 1961/05/01, MRN [id number] PCP: Eustaquio Boyden, MD  [facility] Providers Cardiologist:  Debbe Odea, MD     History of Present Illness: .   CURREN MOHRMANN is a 63 y.o. male with a h/o CAD/PCI to OM2 (9/24, 45% pLAD), HTN, former smoker x 10 to 15 years who presents for follow-up for CAD.   Echo in 2018 showed LVEF 55-60%. Calcium score in 01/2023 was 470 with LAD 399, Lcx 71.1. Echo 9/6/624 showed LVEF 55-60%. Patient had an abnormal stress test in 03/2023. LHC showed severe single vessel CAD with 99% stenosis involving large OM2 branch, moderate LAD and distal Lcx disease, normal LVEF. He was treated with PCI/DES x 1 and started on DAPT with ASA and plavix for at least 6 months. Echo showed LVEF 55-60%, no WMA, mild LVH, G1DD.  He was last seen 07/2023 and was stable from a cardiac perspective.   Today, the patient report she is overall doing OK. He denies bleeding issues. He has bruising. He does not formal exercise. Diet is better. BP is soft. He denies lightheadedness and dizziness.  Studies Reviewed: .       Echo 05/2023  1. Left ventricular ejection fraction, by estimation, is 55 to 60%. The  left ventricle has normal function. The left ventricle has no regional  wall motion abnormalities. There is mild left ventricular hypertrophy.  Left ventricular diastolic parameters  are consistent with Grade I diastolic dysfunction (impaired relaxation).   2. Right ventricular systolic function is normal. The right ventricular  size is normal.   3. The mitral valve is normal in structure. No evidence of mitral valve  regurgitation.   4. The aortic valve is tricuspid. Aortic valve regurgitation is not  visualized.   LHC 05/2023 Conclusions: Severe single-vessel coronary artery disease with hazy 99% stenosis involving large OM2 branch.  There is also moderate proximal mid/LAD and distal LCx  disease. Normal left ventricular systolic function (LVEF 55-65%) and filling pressure (LVEDP 10-15 mmHg). Challenging but successful PCI to 9 9% OM 2 stenosis using Onyx Frontier 2.75 x 12 mm drug-eluting stent with 0% residual stenosis and TIMI-3 flow.  Due to unusual takeoff of OM2, the proximal 50% lesion was not covered, as it was difficult to visualize this lesions proximity to the ostium of OM2.   Recommendations: Dual antiplatelet therapy with aspirin and clopidogrel for at least 6 months. Aggressive secondary prevention of coronary artery disease. Overnight observation.   Yvonne Kendall, MD [facility]          Physical Exam:   VS:  BP 98/60 (BP Location: Left Arm, Patient Position: Sitting, Cuff Size: Normal)   Pulse 61   Ht 6' (1.829 m)   Wt 264 lb 3.2 oz (119.8 kg)   SpO2 99%   BMI 35.83 kg/m    Wt Readings from Last 3 Encounters:  12/18/23 264 lb 3.2 oz (119.8 kg)  07/10/23 (!) 302 lb 3.2 oz (137.1 kg)  05/15/23 299 lb (135.6 kg)    GEN: Well nourished, well developed in no acute distress NECK: No JVD; No carotid bruits CARDIAC: RRR, no murmurs, rubs, gallops RESPIRATORY:  Clear to auscultation without rales, wheezing or rhonchi  ABDOMEN: Soft, non-tender, non-distended EXTREMITIES:  No edema; No deformity   ASSESSMENT AND PLAN: .    CAD s/p PCI OM2 05/2023 The patient denies anginal symptoms. He does no formal activity. Says  diet has improved. Continue ASA, Plavix and Crestor.  HTN BP is soft, I will decrease amlodipine to 5mg  daily.   HLD LDL 105. I will update lipids today. Continue Crestor 20mg  daily.       Dispo: Follow-up in   Signed, Augustine Brannick David Stall, PA-C

## 2023-12-18 NOTE — Patient Instructions (Signed)
 Medication Instructions:  Your physician recommends the following medication changes.  START TAKING: NITRO-STAT -- Nitroglycerin 0.4 mg Sub-lingual (under the tongue dissolving) as needed for chest pain - If a single episode of chest pain is not relieved by one tablet in 5 minutes, take another tablet and after 5 minutes if chest pain continues, take the 3rd tablet; and if this doesn't relieve the pain, call 911 for transportation to an emergency department.  DECREASE: Amlodipine 5 mg daily  *If you need a refill on your cardiac medications before your next appointment, please call your pharmacy*  Lab Work: Your provider would like for you to have following labs drawn today Lipid panel.   If you have labs (blood work) drawn today and your tests are completely normal, you will receive your results only by: MyChart Message (if you have MyChart) OR A paper copy in the mail If you have any lab test that is abnormal or we need to change your treatment, we will call you to review the results.  Follow-Up: At [facility], you and your health needs are our priority.  As part of our continuing mission to provide you with exceptional heart care, our providers are all part of one team.  This team includes your primary Cardiologist (physician) and Advanced Practice Providers or APPs (Physician Assistants and Nurse Practitioners) who all work together to provide you with the care you need, when you need it.  Your next appointment:   6 month(s)  Provider:   You may see Debbe Odea, MD or Cadence Fransico Michael, [place]

## 2023-12-19 LAB — LIPID PANEL
Chol/HDL Ratio: 2.6 ratio (ref 0.0–5.0)
Cholesterol, Total: 81 mg/dL — ABNORMAL LOW (ref 100–199)
HDL: 31 mg/dL — ABNORMAL LOW (ref >39)
LDL Chol Calc (NIH): 35 mg/dL (ref 0–99)
Triglycerides: 65 mg/dL (ref 0–149)
VLDL Cholesterol Cal: 15 mg/dL (ref 5–40)

## 2024-01-05 ENCOUNTER — Ambulatory Visit: Payer: 59 | Admitting: Medical

## 2024-01-20 ENCOUNTER — Other Ambulatory Visit: Payer: Self-pay | Admitting: Family Medicine

## 2024-01-20 DIAGNOSIS — E782 Mixed hyperlipidemia: Secondary | ICD-10-CM

## 2024-01-26 ENCOUNTER — Other Ambulatory Visit: Payer: Self-pay | Admitting: Family Medicine

## 2024-01-26 DIAGNOSIS — Z125 Encounter for screening for malignant neoplasm of prostate: Secondary | ICD-10-CM

## 2024-01-26 DIAGNOSIS — E782 Mixed hyperlipidemia: Secondary | ICD-10-CM

## 2024-01-26 DIAGNOSIS — R931 Abnormal findings on diagnostic imaging of heart and coronary circulation: Secondary | ICD-10-CM

## 2024-01-29 ENCOUNTER — Other Ambulatory Visit (INDEPENDENT_AMBULATORY_CARE_PROVIDER_SITE_OTHER): Payer: 59

## 2024-01-29 DIAGNOSIS — E782 Mixed hyperlipidemia: Secondary | ICD-10-CM

## 2024-01-29 DIAGNOSIS — Z125 Encounter for screening for malignant neoplasm of prostate: Secondary | ICD-10-CM | POA: Diagnosis not present

## 2024-01-29 LAB — COMPREHENSIVE METABOLIC PANEL WITH GFR
ALT: 17 U/L (ref 0–53)
AST: 19 U/L (ref 0–37)
Albumin: 4.3 g/dL (ref 3.5–5.2)
Alkaline Phosphatase: 84 U/L (ref 39–117)
BUN: 19 mg/dL (ref 6–23)
CO2: 29 meq/L (ref 19–32)
Calcium: 9.5 mg/dL (ref 8.4–10.5)
Chloride: 104 meq/L (ref 96–112)
Creatinine, Ser: 0.97 mg/dL (ref 0.40–1.50)
GFR: 83.63 mL/min (ref >60.00)
Glucose, Bld: 96 mg/dL (ref 70–99)
Potassium: 4.1 meq/L (ref 3.5–5.1)
Sodium: 140 meq/L (ref 135–145)
Total Bilirubin: 0.6 mg/dL (ref 0.2–1.2)
Total Protein: 6.6 g/dL (ref 6.0–8.3)

## 2024-01-29 LAB — PSA: PSA: 0.92 ng/mL (ref 0.10–4.00)

## 2024-01-29 LAB — TSH: TSH: 1.66 u[IU]/mL (ref 0.35–5.50)

## 2024-01-30 ENCOUNTER — Ambulatory Visit: Payer: Self-pay | Admitting: Family Medicine

## 2024-02-05 ENCOUNTER — Encounter: Payer: Self-pay | Admitting: Family Medicine

## 2024-02-05 ENCOUNTER — Ambulatory Visit (INDEPENDENT_AMBULATORY_CARE_PROVIDER_SITE_OTHER): Payer: 59 | Admitting: Family Medicine

## 2024-02-05 VITALS — BP 128/82 | HR 61 | Temp 97.7°F | Ht 71.5 in | Wt 251.0 lb

## 2024-02-05 DIAGNOSIS — R0781 Pleurodynia: Secondary | ICD-10-CM

## 2024-02-05 DIAGNOSIS — E66811 Obesity, class 1: Secondary | ICD-10-CM

## 2024-02-05 DIAGNOSIS — Z1211 Encounter for screening for malignant neoplasm of colon: Secondary | ICD-10-CM

## 2024-02-05 DIAGNOSIS — M7989 Other specified soft tissue disorders: Secondary | ICD-10-CM

## 2024-02-05 DIAGNOSIS — I25118 Atherosclerotic heart disease of native coronary artery with other forms of angina pectoris: Secondary | ICD-10-CM

## 2024-02-05 DIAGNOSIS — Z0000 Encounter for general adult medical examination without abnormal findings: Secondary | ICD-10-CM

## 2024-02-05 DIAGNOSIS — K219 Gastro-esophageal reflux disease without esophagitis: Secondary | ICD-10-CM

## 2024-02-05 DIAGNOSIS — E782 Mixed hyperlipidemia: Secondary | ICD-10-CM

## 2024-02-05 DIAGNOSIS — R931 Abnormal findings on diagnostic imaging of heart and coronary circulation: Secondary | ICD-10-CM | POA: Diagnosis not present

## 2024-02-05 DIAGNOSIS — I10 Essential (primary) hypertension: Secondary | ICD-10-CM

## 2024-02-05 DIAGNOSIS — G4733 Obstructive sleep apnea (adult) (pediatric): Secondary | ICD-10-CM

## 2024-02-05 DIAGNOSIS — F419 Anxiety disorder, unspecified: Secondary | ICD-10-CM

## 2024-02-05 MED ORDER — METHOCARBAMOL 500 MG PO TABS: 500.0000 mg | ORAL_TABLET | Freq: Every evening | ORAL | 0 refills | Status: AC | PRN

## 2024-02-05 MED ORDER — ROSUVASTATIN CALCIUM 20 MG PO TABS
20.0000 mg | ORAL_TABLET | Freq: Every day | ORAL | 4 refills | Status: AC
Start: 2024-02-05 — End: ?

## 2024-02-05 NOTE — Assessment & Plan Note (Signed)
Appreciate cardiology care.  °

## 2024-02-05 NOTE — Patient Instructions (Addendum)
 We will sign you up for cologuard.  Try methocarbamol  500mg  1-2 tablets at night as needed for left lateral ribcage pain - possible persistent rib strain.  Good to see you today  Return as needed or in 1 year for next physical.

## 2024-02-05 NOTE — Assessment & Plan Note (Signed)
 Congratulated on 50+ lb weight loss.  He is seeing [facility] clinic.  He continues intermittent fasting.  Continue to encourage healthy diet and lifestyle choices for sustainable weight loss. Notes goal weight <200 lbs

## 2024-02-05 NOTE — Assessment & Plan Note (Signed)
 Chronic issue with anger/irritability.  Discussed healthy stress relieving strategies.  Declines medication.

## 2024-02-05 NOTE — Assessment & Plan Note (Signed)
 Appreciate cardiology care, now on crestor , aspirin , plavix 

## 2024-02-05 NOTE — Assessment & Plan Note (Signed)
 Chronic, stable on crestor  20mg  - continue. The ASCVD Risk score (Arnett DK, et al., 2019) failed to calculate for the following reasons:   The valid total cholesterol range is 130 to 320 mg/dL

## 2024-02-05 NOTE — Assessment & Plan Note (Signed)
Chronic, stable on low dose amlodipine.  

## 2024-02-05 NOTE — Assessment & Plan Note (Signed)
 Suspect rib strain. Discussed heating pad use. Rx robaxin muscle relaxant to use PRN, discussed sedation precautions.

## 2024-02-05 NOTE — Progress Notes (Signed)
 Ph: [phone] Fax: [phone]   [name], male    DOB: [birth year], 63 y.o.   MRN: [id number]  This visit was conducted in person.  BP 128/82   Pulse 61   Temp 97.7 F (36.5 C) (Oral)   Ht 5' 11.5" (1.816 m)   Wt 251 lb (113.9 kg)   SpO2 99%   BMI 34.52 kg/m    CC: CPE Subjective:   HPI: Phillip Miles is a 63 y.o. male presenting on 02/05/2024 for Annual Exam   50 lb weight loss in the past year - following [place] weight loss program - doing intermittent fasting, as well as taking drops. Goal is <200lbs.   2 mo h/o L posterior lateral ribcage pain - may have stared after pulling muscle there. Since then, having L thoracic back pain at night. Rest of the day doesn't bother him. No bruising, skin changes.   H/o OSA not on CPAP. Sleep study in chart 2017. Had improved with weight loss. Snoring has improved. No daytime somnolence.   01/2023 - Cardiac CT with CCS >400 in LAD, LCx - referred back to cardiology s/p catheterization with PCI OM2 05/2023.  Now on crestor  20mg  daily, aspirin  and plavix .   Chronic L leg swelling present for years. Venous ultrasound LLE 2021 - negative for DVT.   Preventative: COLONOSCOPY 01/2012; HP, rpt 10 yrs Willy Harvest) - agrees to iFOB.  Prostate screening - yearly given family history (father). Nocturia 2-3x.  Lung cancer screening - not eligible  Flu shot yearly  COVID vaccine Pfizer 09/2020, 10/2020  Td 2006, Tdap 2019  Shingrix - 01/2021, 04/2021  Seat belt use discussed  Sunscreen use discussed. No changing moles on skin. Sees derm yearly.  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 Interim medical history since our last visit reviewed. Allergies and medications reviewed and updated. Outpatient Medications Prior to Visit  Medication Sig Dispense Refill   acetaminophen  (TYLENOL  8 HOUR ARTHRITIS PAIN) 650 MG CR tablet Take 2 tablets (1,300 mg total) by mouth daily as needed for pain.     amLODipine  (NORVASC ) 5 MG tablet Take 1 tablet (5 mg total) by mouth daily. 90 tablet 3   aspirin  EC 81 MG tablet Take 1 tablet (81 mg total) by mouth daily. Swallow whole. 30 tablet 12   clopidogrel  (PLAVIX ) 75 MG tablet Take 1 tablet (75 mg total) by mouth daily with breakfast. 90 tablet 3   IRON CR PO Take by mouth daily.     loratadine  (CLARITIN ) 10 MG tablet Take 10 mg by mouth daily as needed for allergies.     Misc Natural Products (GLUCOSAMINE-CHONDROITIN SULF PO) Take 1 capsule by mouth.     Multiple Vitamin (MULTIVITAMIN) tablet Take 1 tablet by mouth daily.     nitroGLYCERIN  (NITROSTAT ) 0.4 MG SL tablet Place 1 tablet (0.4 mg total) under the tongue every 5 (five) minutes as needed for chest pain (max dose of three and call 911). 25 tablet 3  pantoprazole  (PROTONIX ) 40 MG tablet Take 1 tablet (40 mg total) by mouth daily. 90 tablet 3   rosuvastatin  (CRESTOR ) 20 MG tablet TAKE 1 TABLET BY MOUTH EVERY DAY 90 tablet 0   No facility-administered medications prior to visit.     Per HPI unless specifically indicated in ROS section below Review of Systems  Constitutional:  Negative for activity change, appetite change, chills, fatigue, fever and unexpected weight change.  HENT:  Negative for hearing loss.   Eyes:  Negative for visual disturbance.  Respiratory:  Negative for cough, chest tightness, shortness of breath and wheezing.   Cardiovascular:  Positive for leg swelling (chronic left sided). Negative for chest pain and palpitations.  Gastrointestinal:   Positive for constipation (occ). Negative for abdominal distention, abdominal pain, blood in stool, diarrhea, nausea and vomiting.  Genitourinary:  Negative for difficulty urinating and hematuria.  Musculoskeletal:  Negative for arthralgias, myalgias and neck pain.  Skin:  Negative for rash.  Neurological:  Negative for dizziness, seizures, syncope and headaches.  Hematological:  Negative for adenopathy. Bruises/bleeds easily.  Psychiatric/Behavioral:  Positive for dysphoric mood. The patient is not nervous/anxious.     Objective:  BP 128/82   Pulse 61   Temp 97.7 F (36.5 C) (Oral)   Ht 5' 11.5" (1.816 m)   Wt 251 lb (113.9 kg)   SpO2 99%   BMI 34.52 kg/m   Wt Readings from Last 3 Encounters:  02/05/24 251 lb (113.9 kg)  12/18/23 264 lb 3.2 oz (119.8 kg)  07/10/23 (!) 302 lb 3.2 oz (137.1 kg)      Physical Exam Vitals and nursing note reviewed.  Constitutional:      General: He is not in acute distress.    Appearance: Normal appearance. He is well-developed. He is not ill-appearing.  HENT:     Head: Normocephalic and atraumatic.     Right Ear: Hearing, tympanic membrane, ear canal and external ear normal.     Left Ear: Hearing, tympanic membrane, ear canal and external ear normal.     Mouth/Throat:     Mouth: Mucous membranes are moist.     Pharynx: Oropharynx is clear. No oropharyngeal exudate or posterior oropharyngeal erythema.  Eyes:     General: No scleral icterus.    Extraocular Movements: Extraocular movements intact.     Conjunctiva/sclera: Conjunctivae normal.     Pupils: Pupils are equal, round, and reactive to light.  Neck:     Thyroid : No thyroid  mass or thyromegaly.  Cardiovascular:     Rate and Rhythm: Normal rate and regular rhythm.     Pulses: Normal pulses.          Radial pulses are 2+ on the right side and 2+ on the left side.     Heart sounds: Normal heart sounds. No murmur heard. Pulmonary:     Effort: Pulmonary effort is normal. No respiratory  distress.     Breath sounds: Normal breath sounds. No wheezing, rhonchi or rales.     Comments: No reproducible chest wall tenderness to posterior or lateral left ribcage  Chest:     Chest wall: No tenderness.  Abdominal:     General: Bowel sounds are normal. There is no distension.     Palpations: Abdomen is soft. There is no mass.     Tenderness: There is no abdominal tenderness. There is no guarding or rebound.     Hernia: No hernia is present.  Musculoskeletal:        General: No  tenderness. Normal range of motion.     Cervical back: Normal range of motion and neck supple.     Right lower leg: No edema.     Left lower leg: No edema.     Comments:  No midline thoracic spine pain No rhomboid mm pain to palpation   Lymphadenopathy:     Cervical: No cervical adenopathy.  Skin:    General: Skin is warm and dry.     Findings: No rash.  Neurological:     General: No focal deficit present.     Mental Status: He is alert and oriented to person, place, and time.  Psychiatric:        Mood and Affect: Mood normal.        Behavior: Behavior normal.        Thought Content: Thought content normal.        Judgment: Judgment normal.       Results for orders placed or performed in visit on 01/29/24  TSH   Collection Time: 01/29/24  7:19 AM  Result Value Ref Range   TSH 1.66 0.35 - 5.50 uIU/mL  PSA   Collection Time: 01/29/24  7:19 AM  Result Value Ref Range   PSA 0.92 0.10 - 4.00 ng/mL  Comprehensive metabolic panel with GFR   Collection Time: 01/29/24  7:19 AM  Result Value Ref Range   Sodium 140 135 - 145 mEq/L   Potassium 4.1 3.5 - 5.1 mEq/L   Chloride 104 96 - 112 mEq/L   CO2 29 19 - 32 mEq/L   Glucose, Bld 96 70 - 99 mg/dL   BUN 19 6 - 23 mg/dL   Creatinine, Ser 1.61 0.40 - 1.50 mg/dL   Total Bilirubin 0.6 0.2 - 1.2 mg/dL   Alkaline Phosphatase 84 39 - 117 U/L   AST 19 0 - 37 U/L   ALT 17 0 - 53 U/L   Total Protein 6.6 6.0 - 8.3 g/dL   Albumin 4.3 3.5 - 5.2 g/dL    GFR 09.60 >45.40 mL/min   Calcium  9.5 8.4 - 10.5 mg/dL      9/81/1914    7:82 AM 01/30/2023    8:33 AM 01/24/2022   11:00 AM 01/18/2021   11:29 AM 12/16/2019   10:02 AM  Depression screen PHQ 2/9  Decreased Interest 2 2 1 1  0  Down, Depressed, Hopeless 1 1 1 1 1   PHQ - 2 Score 3 3 2 2 1   Altered sleeping 0 3 2 1 1   Tired, decreased energy 1 2 2 1 2   Change in appetite 1 3 2 2 2   Feeling bad or failure about yourself  1 1 1 1 1   Trouble concentrating 0 0 0 0 1  Moving slowly or fidgety/restless 0 0 0 0 0  Suicidal thoughts 0 0 0 0 0  PHQ-9 Score 6 12 9 7 8   Difficult doing work/chores Somewhat difficult Somewhat difficult Somewhat difficult        02/05/2024    8:35 AM 01/30/2023    8:33 AM 01/24/2022   11:00 AM 01/18/2021   11:30 AM  GAD 7 : Generalized Anxiety Score  Nervous, Anxious, on Edge 1 1 2 1   Control/stop worrying 1 1 3 1   Worry too much - different things 1 1 2 2   Trouble relaxing 0 1 2 1   Restless 0 1 2 0  Easily annoyed or irritable 3 2 3 2   Afraid - awful might happen 1 2  1 0  Total GAD 7 Score 7 9 15 7   Anxiety Difficulty Somewhat difficult Somewhat difficult Somewhat difficult    Assessment & Plan:   Problem List Items Addressed This Visit     Health maintenance examination - Primary (Chronic)   Preventative protocols reviewed and updated unless pt declined. Discussed healthy diet and lifestyle.       GERD (gastroesophageal reflux disease)   Continues PPI       Essential hypertension   Chronic, stable on low dose amlodipine .       Relevant Medications   rosuvastatin  (CRESTOR ) 20 MG tablet   Obesity, Class I, BMI 30-34.9   Congratulated on 50+ lb weight loss.  He is seeing [facility].  He continues intermittent fasting.  Continue to encourage healthy diet and lifestyle choices for sustainable weight loss. Notes goal weight <200 lbs      OSA (obstructive sleep apnea)   Not on CPAP.  Anticipate improvement after significant  weight loss.       HLD (hyperlipidemia)   Chronic, stable on crestor  20mg  - continue. The ASCVD Risk score (Arnett DK, et al., 2019) failed to calculate for the following reasons:   The valid total cholesterol range is 130 to 320 mg/dL       Relevant Medications   rosuvastatin  (CRESTOR ) 20 MG tablet   Left leg swelling   Chronic LLE swelling, persistent but notes improvement with weight loss.  Did not tolerate compression stockings well.  No foot swelling.  S/p reassuring venous US  12/2019 negative for DVT      Anxiety   Chronic issue with anger/irritability.  Discussed healthy stress relieving strategies.  Declines medication.       Agatston coronary artery calcium  score greater than 400   Appreciate cardiology care.       Relevant Medications   rosuvastatin  (CRESTOR ) 20 MG tablet   CAD (coronary artery disease)   Appreciate cardiology care, now on crestor , aspirin , plavix       Relevant Medications   rosuvastatin  (CRESTOR ) 20 MG tablet   Rib pain on left side   Suspect rib strain. Discussed heating pad use. Rx robaxin muscle relaxant to use PRN, discussed sedation precautions.       Other Visit Diagnoses       Special screening for malignant neoplasms, colon       Relevant Orders   Cologuard        Meds ordered this encounter  Medications   rosuvastatin  (CRESTOR ) 20 MG tablet    Sig: Take 1 tablet (20 mg total) by mouth daily.    Dispense:  90 tablet    Refill:  4   methocarbamol (ROBAXIN) 500 MG tablet    Sig: Take 1-2 tablets (500-1,000 mg total) by mouth at bedtime as needed for muscle spasms (sedation precautions).    Dispense:  40 tablet    Refill:  0    Orders Placed This Encounter  Procedures   Cologuard    Patient Instructions  We will sign you up for cologuard.  Try methocarbamol 500mg  1-2 tablets at night as needed for left lateral ribcage pain - possible persistent rib strain.  Good to see you today  Return as needed or in 1 year for  next physical.   Follow up plan: Return in about 1 year (around 02/04/2025) for annual exam, prior fasting for blood work.  Claire Crick, MD

## 2024-02-05 NOTE — Assessment & Plan Note (Signed)
 Preventative protocols reviewed and updated unless pt declined. Discussed healthy diet and lifestyle.

## 2024-02-05 NOTE — Assessment & Plan Note (Addendum)
 Chronic LLE swelling, persistent but notes improvement with weight loss.  Did not tolerate compression stockings well.  No foot swelling.  S/p reassuring venous US  12/2019 negative for DVT

## 2024-02-05 NOTE — Assessment & Plan Note (Signed)
Continues PPI.  

## 2024-02-05 NOTE — Assessment & Plan Note (Signed)
 Not on CPAP.  Anticipate improvement after significant weight loss.

## 2024-02-12 ENCOUNTER — Other Ambulatory Visit: Payer: Self-pay

## 2024-02-12 DIAGNOSIS — I10 Essential (primary) hypertension: Secondary | ICD-10-CM

## 2024-02-12 MED ORDER — AMLODIPINE BESYLATE 5 MG PO TABS: 5.0000 mg | ORAL_TABLET | Freq: Every day | ORAL | 3 refills | Status: AC

## 2024-02-17 LAB — COLOGUARD: COLOGUARD: NEGATIVE

## 2024-02-18 ENCOUNTER — Ambulatory Visit: Payer: Self-pay | Admitting: Family Medicine

## 2024-04-13 ENCOUNTER — Other Ambulatory Visit: Payer: Self-pay | Admitting: Medical

## 2024-05-04 ENCOUNTER — Other Ambulatory Visit: Payer: Self-pay | Admitting: Medical

## 2024-07-21 NOTE — Progress Notes (Unsigned)
 Cardiology Office Note   Date:  07/22/2024  ID:  Phillip Miles, DOB 1961/06/08, MRN [id number] PCP: Phillip Baller, MD  [facility] Providers Cardiologist:  Phillip Cave, MD   History of Present Illness Phillip Miles is a 63 y.o. male with a h/o CAD/PCI to OM2 (9/24, 45% pLAD), HTN, former smoker x 10 to 15 years who presents for follow-up for CAD.    Echo in 2018 showed LVEF 55-60%. Calcium  score in 01/2023 was 470 with LAD 399, Lcx 71.1. Echo 9/6/624 showed LVEF 55-60%. Patient had an abnormal stress test in 03/2023. LHC showed severe single vessel CAD with 99% stenosis involving large OM2 branch, moderate LAD and distal Lcx disease, normal LVEF. He was treated with PCI/DES x 1 and started on DAPT with ASA and plavix  for at least 6 months. Echo showed LVEF 55-60%, no WMA, mild LVH, G1DD.  Patient was last seen 12/18/2023 and was overall stable from a cardiac perspective.  Today, the patient is overall doing well from a cardiac perspective. He has arthritic pain in his knees and wrist. He has rare chest discomfort that may be GERD. He is unsure if it's similar to prior angina. discomfort is non-exertional. HE denies shortness of breath. He has chronic mild edema on the left side.   Studies Reviewed EKG Interpretation Date/Time:  Friday July 22 2024 08:54:26 EST Ventricular Rate:  56 PR Interval:  166 QRS Duration:  82 QT Interval:  418 QTC Calculation: 403 R Axis:   25  Text Interpretation: Sinus bradycardia When compared with ECG of 10-Jul-2023 11:09, Criteria for Septal infarct are no longer Present Confirmed by Phillip, Dayani Miles ([id number]) on 07/22/2024 9:01:35 AM    Echo 05/2023  1. Left ventricular ejection fraction, by estimation, is 55 to 60%. The  left ventricle has normal function. The left ventricle has no regional  wall motion abnormalities. There is mild left ventricular hypertrophy.  Left ventricular diastolic parameters  are consistent with Grade I  diastolic dysfunction (impaired relaxation).   2. Right ventricular systolic function is normal. The right ventricular  size is normal.   3. The mitral valve is normal in structure. No evidence of mitral valve  regurgitation.   4. The aortic valve is tricuspid. Aortic valve regurgitation is not  visualized.    LHC 05/2023 Conclusions: Severe single-vessel coronary artery disease with hazy 99% stenosis involving large OM2 branch.  There is also moderate proximal mid/LAD and distal LCx disease. Normal left ventricular systolic function (LVEF 55-65%) and filling pressure (LVEDP 10-15 mmHg). Challenging but successful PCI to 9 9% OM 2 stenosis using Onyx Frontier 2.75 x 12 mm drug-eluting stent with 0% residual stenosis and TIMI-3 flow.  Due to unusual takeoff of OM2, the proximal 50% lesion was not covered, as it was difficult to visualize this lesions proximity to the ostium of OM2.   Recommendations: Dual antiplatelet therapy with aspirin  and clopidogrel  for at least 6 months. Aggressive secondary prevention of coronary artery disease. Overnight observation.   Phillip Hanson, MD [facility]  Physical Exam VS:  BP 126/68   Pulse (!) 56   Ht 6' (1.829 m)   Wt 229 lb 12.8 oz (104.2 kg)   SpO2 98%   BMI 31.17 kg/m        Wt Readings from Last 3 Encounters:  07/22/24 229 lb 12.8 oz (104.2 kg)  02/05/24 251 lb (113.9 kg)  12/18/23 264 lb 3.2 oz (119.8 kg)    GEN: Well nourished, well developed  in no acute distress NECK: No JVD; No carotid bruits CARDIAC: RRR, no murmurs, rubs, gallops RESPIRATORY:  Clear to auscultation without rales, wheezing or rhonchi  ABDOMEN: Soft, non-tender, non-distended EXTREMITIES:  No edema; No deformity   ASSESSMENT AND PLAN  CAD The patient reports rare chest pain that he is unsure if it's his heart. It is nonexertional and may be from GERD. No SOB reported. He remains active at home at as his job. It has been over a years since his stent, so I  will stop Plavix . Continue ASA, Lipitor, SL NTG. No BB given bradycardia. We will continue to monitor symptoms.   HTN BP is good today, continue amlodipine  5mg  daily.   HLD Repeat lipid panel. Continue Crestor  20mg  daily.     Dispo: Follow-up in 6 months  Signed, Phillip Vogel VEAR Fishman, PA-C

## 2024-07-22 ENCOUNTER — Ambulatory Visit: Attending: Medical | Admitting: Medical

## 2024-07-22 ENCOUNTER — Encounter: Payer: Self-pay | Admitting: Medical

## 2024-07-22 VITALS — BP 126/68 | HR 56 | Ht 72.0 in | Wt 229.8 lb

## 2024-07-22 DIAGNOSIS — Z79899 Other long term (current) drug therapy: Secondary | ICD-10-CM | POA: Diagnosis not present

## 2024-07-22 DIAGNOSIS — E782 Mixed hyperlipidemia: Secondary | ICD-10-CM | POA: Diagnosis not present

## 2024-07-22 DIAGNOSIS — I2510 Atherosclerotic heart disease of native coronary artery without angina pectoris: Secondary | ICD-10-CM | POA: Diagnosis not present

## 2024-07-22 DIAGNOSIS — I10 Essential (primary) hypertension: Secondary | ICD-10-CM

## 2024-07-22 NOTE — Patient Instructions (Signed)
 Medication Instructions:  Your physician recommends the following medication changes.  STOP TAKING: Plavix    *If you need a refill on your cardiac medications before your next appointment, please call your pharmacy*  Lab Work: Your provider would like for you to have following labs drawn today CBC, Lipid.     Testing/Procedures: No test ordered today   Follow-Up: At [facility], you and your health needs are our priority.  As part of our continuing mission to provide you with exceptional heart care, our providers are all part of one team.  This team includes your primary Cardiologist (physician) and Advanced Practice Providers or APPs (Physician Assistants and Nurse Practitioners) who all work together to provide you with the care you need, when you need it.  Your next appointment:   6 month(s)  Provider:   You may see Redell Cave, MD or one of the following Advanced Practice Providers on your designated Care Team:   Cadence [place], PA-C

## 2024-07-23 LAB — LIPID PANEL
Chol/HDL Ratio: 2.7 ratio (ref 0.0–5.0)
Cholesterol, Total: 118 mg/dL (ref 100–199)
HDL: 43 mg/dL (ref >39)
LDL Chol Calc (NIH): 62 mg/dL (ref 0–99)
Triglycerides: 61 mg/dL (ref 0–149)
VLDL Cholesterol Cal: 13 mg/dL (ref 5–40)

## 2024-07-23 LAB — CBC
Hematocrit: 42.7 % (ref 37.5–51.0)
Hemoglobin: 13.8 g/dL (ref 13.0–17.7)
MCH: 29.1 pg (ref 26.6–33.0)
MCHC: 32.3 g/dL (ref 31.5–35.7)
MCV: 90 fL (ref 79–97)
Platelets: 193 x10E3/uL (ref 150–450)
RBC: 4.74 x10E6/uL (ref 4.14–5.80)
RDW: 13 % (ref 11.6–15.4)
WBC: 6.6 x10E3/uL (ref 3.4–10.8)

## 2024-07-25 ENCOUNTER — Ambulatory Visit: Payer: Self-pay | Admitting: Medical

## 2025-02-10 ENCOUNTER — Other Ambulatory Visit

## 2025-02-17 ENCOUNTER — Encounter: Admitting: Family Medicine
